# Patient Record
Sex: Male | Born: 1990 | Race: White | Hispanic: No | Marital: Married | Smoking: Former smoker
Health system: Southern US, Community
[De-identification: ages and names within clinical notes are randomized; demographics above are authoritative.]

## PROBLEM LIST (undated history)

## (undated) DIAGNOSIS — E781 Pure hyperglyceridemia: Secondary | ICD-10-CM

## (undated) DIAGNOSIS — K219 Gastro-esophageal reflux disease without esophagitis: Secondary | ICD-10-CM

## (undated) DIAGNOSIS — E039 Hypothyroidism, unspecified: Secondary | ICD-10-CM

## (undated) DIAGNOSIS — D47Z2 Castleman disease: Secondary | ICD-10-CM

## (undated) HISTORY — DX: Castleman disease: D47.Z2

## (undated) HISTORY — PX: APPENDECTOMY: SHX54

## (undated) HISTORY — PX: ANKLE SURGERY: SHX546

## (undated) HISTORY — PX: WISDOM TOOTH EXTRACTION: SHX21

---
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
## 2011-01-20 ENCOUNTER — Other Ambulatory Visit: Payer: Self-pay | Admitting: *Deleted

## 2011-01-20 DIAGNOSIS — R222 Localized swelling, mass and lump, trunk: Secondary | ICD-10-CM

## 2011-01-24 ENCOUNTER — Ambulatory Visit
Admission: RE | Admit: 2011-01-24 | Discharge: 2011-01-24 | Disposition: A | Discharge status: Home / Self Care | Payer: 59 | Source: Ambulatory Visit | Attending: *Deleted | Admitting: *Deleted

## 2011-01-24 DIAGNOSIS — R222 Localized swelling, mass and lump, trunk: Secondary | ICD-10-CM

## 2011-01-24 MED ORDER — IOHEXOL 300 MG/ML  SOLN
75.0000 mL | Freq: Once | INTRAMUSCULAR | Status: AC | PRN
  Administered 2011-01-24: 75 mL via INTRAVENOUS

## 2011-02-08 ENCOUNTER — Encounter (INDEPENDENT_AMBULATORY_CARE_PROVIDER_SITE_OTHER): Payer: Self-pay | Admitting: General Surgery

## 2011-02-09 ENCOUNTER — Ambulatory Visit (INDEPENDENT_AMBULATORY_CARE_PROVIDER_SITE_OTHER): Payer: 59 | Admitting: General Surgery

## 2011-02-09 ENCOUNTER — Encounter (INDEPENDENT_AMBULATORY_CARE_PROVIDER_SITE_OTHER): Payer: Self-pay | Admitting: General Surgery

## 2011-02-09 VITALS — BP 148/88 | HR 64 | Temp 97.7°F | Resp 18 | Ht 74.5 in | Wt 304.0 lb

## 2011-02-09 DIAGNOSIS — R590 Localized enlarged lymph nodes: Secondary | ICD-10-CM

## 2011-02-09 DIAGNOSIS — R599 Enlarged lymph nodes, unspecified: Secondary | ICD-10-CM

## 2011-02-09 NOTE — Progress Notes (Signed)
Chief Complaint  Patient presents with  . Other    new pt eval of enlarged lymph nodes in left axilla seen on CT scan     HPI Jonathon Buchanan is a 20 y.o. male.   HPI 20 year old Caucasian male referred by his primary care physician's office for evaluation of an enlarged left axillary lymph node. The patient underwent a routine physical exam and screening for his job. As part of that process he underwent a chest x-ray at [facility] which demonstrated an abnormality in his lungs. This prompted a CT scan of his chest which was performed on October 2. The CT did not finding the long abnormality; however, it did find an enlarged left axillary lymph node measuring 22 x 37 x 34 mm.  The patient denies any fevers, chills, night sweats, weight loss, or injury to his left axilla. He denies any other enlarged glands or lymph nodes. He denies any nausea, vomiting diarrhea or constipation. He denies any personal history of cancer. His maternal grandmother had lung cancer. There is some family history of lupus and rheumatoid arthritis. He denies any tobacco or drug use. He denies any lumps or bumps in his pectoral area. Past Medical History  Diagnosis Date  . Thyroid disorder     hypothyrodism     Past Surgical History  Procedure Date  . Appendectomy   . Ankle surgery     bone removed from both ankles     History reviewed. No pertinent family history.  Social History History  Substance Use Topics  . Smoking status: Former Games developer  . Smokeless tobacco: Never Used  . Alcohol Use: Yes    Allergies  Allergen Reactions  . Sulfa Antibiotics Rash    Current Outpatient Prescriptions  Medication Sig Dispense Refill  . levothyroxine (SYNTHROID, LEVOTHROID) 125 MCG tablet Take 125 mcg by mouth daily.          Review of Systems Review of Systems  Constitutional: Negative for fever, chills, activity change, fatigue and unexpected weight change.  HENT: Negative for hearing  loss, congestion, sore throat, trouble swallowing, neck pain, neck stiffness and voice change.   Eyes: Negative for visual disturbance.  Respiratory: Negative for cough, chest tightness and wheezing.   Cardiovascular: Negative for chest pain, palpitations and leg swelling.  Gastrointestinal: Negative for nausea, vomiting, abdominal pain, diarrhea, constipation, blood in stool, abdominal distention, anal bleeding and rectal pain.  Genitourinary: Negative for hematuria and difficulty urinating.  Musculoskeletal: Negative for arthralgias.  Skin: Negative for rash and wound.       Some scattered ingrown hairs  Neurological: Negative for seizures, syncope, weakness and headaches.  Hematological: Negative for adenopathy. Does not bruise/bleed easily.  Psychiatric/Behavioral: Negative for confusion.    Blood pressure 148/88, pulse 64, temperature 97.7 F (36.5 C), resp. rate 18, height 6' 2.5" (1.892 m), weight 304 lb (137.893 kg).  Physical Exam Physical Exam  Vitals reviewed. Constitutional: He is oriented to person, place, and time. He appears well-developed and well-nourished.  HENT:  Head: Normocephalic.  Eyes: Conjunctivae are normal. No scleral icterus.  Neck: Normal range of motion. Neck supple. No tracheal deviation present. No thyromegaly present.  Cardiovascular: Normal rate, regular rhythm, normal heart sounds and intact distal pulses.   Pulmonary/Chest: Effort normal and breath sounds normal. No respiratory distress. He has no wheezes. Right breast exhibits no mass and no skin change. Left breast exhibits no mass and no skin change. Breasts are symmetrical.  Abdominal: Soft. Bowel sounds  are normal. He exhibits no distension. There is no tenderness.  Musculoskeletal: Normal range of motion. He exhibits no edema and no tenderness.  Lymphadenopathy:       Head (right side): No submandibular, no preauricular and no posterior auricular adenopathy present.       Head (left side): No  submandibular, no preauricular and no posterior auricular adenopathy present.    He has no cervical adenopathy.    He has no axillary adenopathy.       Right axillary: No pectoral and no lateral adenopathy present.       Left axillary: No pectoral and no lateral adenopathy present.      Right: No inguinal and no supraclavicular adenopathy present.       Left: No inguinal and no supraclavicular adenopathy present.  Neurological: He is alert and oriented to person, place, and time. He exhibits normal muscle tone.  Skin: Skin is warm and dry. No rash noted. No erythema.  Psychiatric: He has a normal mood and affect. His behavior is normal. Judgment and thought content normal.    Data Reviewed CT CHEST WITH CONTRAST  Technique: Multidetector CT imaging of the chest was performed  following the standard protocol during bolus administration of  intravenous contrast.  Contrast: 75mL OMNIPAQUE IOHEXOL 300 MG/ML IV SOLN  Comparison: Dictated report from chest x[facility] from [facility] dated 01/17/2011   Findings: On the lung window images, no lung infiltrate is seen.  No lung nodule is noted. There is no evidence of pleural effusion.  No bony abnormality is seen.   On soft tissue window images, the thyroid gland is unremarkable.  No mediastinal or hilar adenopathy is seen. The central airways  are patent. However, there is an enlarged left axillary node  present. This left axillary node measures 22 x 37 x 34 mm.  Clinical correlation is recommended and biopsy could be performed  of this abnormally enlarged node. No bony abnormality is noted.  What is seen of the upper abdomen is unremarkable although there  may be mild fatty infiltration of the liver.   IMPRESSION:  1. No mediastinal or hilar adenopathy or mass is seen. The area  questioned on chest x-ray may have represented overlapping  vasculature. Direct comparison with the prior chest x-ray would be  helpful.    2. However, there is an abnormally enlarged lymph node in the left  axilla. Consider biopsy.   Assessment    Left axillary enlarged lymph node Hypothyroidism    Plan    We discussed lymphadenopathy and lymph nodes. The patient was given educational material. My best guess is that this is a benign reactive lymph node given the lack of symptoms & no other LAD detected on physical exam . However it is somewhat large measuring around 4 x 3 cm. I believe the quickest and most direct way to evaluate this lesion would be to perform an ultrasound-guided core biopsy of the enlarged lymph node. We did discuss another management option such as observation for 8 weeks with repeat imaging. The patient has elected to proceed with an ultrasound-guided core biopsy of the enlarged lymph node. I will base the followup on the results of the biopsy.  Mary Sella. Andrey Campanile, MD, FACS       Gaynelle Adu M 02/09/2011, 10:05 AM

## 2011-02-09 NOTE — Patient Instructions (Signed)
Lymphadenopathy Lymphadenopathy means "disease of the lymph glands." But the term is usually used to describe swollen or enlarged lymph glands, also called lymph nodes. These are the bean-shaped organs found in many locations including the neck, underarm, and groin. Lymph glands are part of the immune system, which fights infections in your body. Lymphadenopathy can occur in just one area of the body, such as the neck, or it can be generalized, with lymph node enlargement in several areas. The nodes found in the neck are the most common sites of lymphadenopathy. CAUSES  When your immune system responds to germs (such as viruses or bacteria ), infection-fighting cells and fluid build up. This causes the glands to grow in size. This is usually not something to worry about. Sometimes, the glands themselves can become infected and inflamed. This is called lymphadenitis. Enlarged lymph nodes can be caused by many diseases:  Bacterial disease, such as strep throat or a skin infection.   Viral disease, such as a common cold.   Other germs, such as lyme disease, tuberculosis, or sexually transmitted diseases.   Cancers, such as lymphoma (cancer of the lymphatic system) or leukemia (cancer of the white blood cells).   Inflammatory diseases such as lupus or rheumatoid arthritis.   Reactions to medications.  Many of the diseases above are rare, but important. This is why you should see your caregiver if you have lymphadenopathy. SYMPTOMS   Swollen, enlarged lumps in the neck, back of the head or other locations.   Tenderness.   Warmth or redness of the skin over the lymph nodes.   Fever.  DIAGNOSIS  Enlarged lymph nodes are often near the source of infection. They can help healthcare providers diagnose your illness. For instance:   Swollen lymph nodes around the jaw might be caused by an infection in the mouth.   Enlarged glands in the neck often signal a throat infection.   Lymph nodes that  are swollen in more than one area often indicate an illness caused by a virus.  Your caregiver most likely will know what is causing your lymphadenopathy after listening to your history and examining you. Blood tests, x-rays or other tests may be needed. If the cause of the enlarged lymph node cannot be found, and it does not go away by itself, then a biopsy may be needed. Your caregiver will discuss this with you. TREATMENT  Treatment for your enlarged lymph nodes will depend on the cause. Many times the nodes will shrink to normal size by themselves, with no treatment. Antibiotics or other medicines may be needed for infection. Only take over-the-counter or prescription medicines for pain, discomfort or fever as directed by your caregiver. HOME CARE INSTRUCTIONS  Swollen lymph glands usually return to normal when the underlying medical condition goes away. If they persist, contact your health-care provider. He/she might prescribe antibiotics or other treatments, depending on the diagnosis. Take any medications exactly as prescribed. Keep any follow-up appointments made to check on the condition of your enlarged nodes.  SEEK MEDICAL CARE IF:   Swelling lasts for more than two weeks.   You have symptoms such as weight loss, night sweats, fatigue or fever that does not go away.   The lymph nodes are hard, seem fixed to the skin or are growing rapidly.   Skin over the lymph nodes is red and inflamed. This could mean there is an infection.  SEEK IMMEDIATE MEDICAL CARE IF:   Fluid starts leaking from the area of the   enlarged lymph node.   You develop a fever of 102 F (38.9 C) or greater.   Severe pain develops (not necessarily at the site of a large lymph node).   You develop chest pain or shortness of breath.   You develop worsening abdominal pain.  MAKE SURE YOU:   Understand these instructions.   Will watch your condition.   Will get help right away if you are not doing well or get  worse.  Document Released: 01/18/2008 Document Revised: 12/21/2010 Document Reviewed: 01/18/2008 ExitCare Patient Information 2012 [employer]. 

## 2011-02-10 NOTE — Progress Notes (Signed)
Addended byLiliana Cline on: 02/10/2011 09:52 AM   Modules accepted: Orders

## 2011-02-16 ENCOUNTER — Other Ambulatory Visit: Payer: Self-pay | Admitting: Diagnostic Radiology

## 2011-02-16 ENCOUNTER — Ambulatory Visit
Admission: RE | Admit: 2011-02-16 | Discharge: 2011-02-16 | Disposition: A | Discharge status: Home / Self Care | Payer: 59 | Source: Ambulatory Visit | Attending: General Surgery | Admitting: General Surgery

## 2011-02-16 DIAGNOSIS — R590 Localized enlarged lymph nodes: Secondary | ICD-10-CM

## 2011-02-21 ENCOUNTER — Telehealth (INDEPENDENT_AMBULATORY_CARE_PROVIDER_SITE_OTHER): Payer: Self-pay | Admitting: General Surgery

## 2011-02-21 NOTE — Telephone Encounter (Signed)
Patient has appt scheduled 03/10/11.

## 2011-02-21 NOTE — Telephone Encounter (Signed)
Message copied by Liliana Cline on Tue Feb 21, 2011  2:45 PM ------      Message from: Andrey Campanile, ERIC M      Created: Tue Feb 21, 2011  2:15 PM       Need to see pt in f/u appt to discuss path & recommended plan

## 2011-02-21 NOTE — Telephone Encounter (Signed)
Path results sent to patient's primary MD per Dr Andrey Campanile.

## 2011-03-10 ENCOUNTER — Encounter (INDEPENDENT_AMBULATORY_CARE_PROVIDER_SITE_OTHER): Payer: 59 | Admitting: General Surgery

## 2011-03-10 ENCOUNTER — Telehealth (INDEPENDENT_AMBULATORY_CARE_PROVIDER_SITE_OTHER): Payer: Self-pay | Admitting: General Surgery

## 2011-03-14 ENCOUNTER — Telehealth (INDEPENDENT_AMBULATORY_CARE_PROVIDER_SITE_OTHER): Payer: Self-pay | Admitting: General Surgery

## 2011-03-14 NOTE — Telephone Encounter (Signed)
Made patient appt for 03/23/11 at 3:15pm. Left message for patient to call back if this is not a good date/time and to ask directly for me.

## 2011-03-14 NOTE — Telephone Encounter (Signed)
Message copied by Liliana Cline on Tue Mar 14, 2011  8:54 AM ------      Message from: Jacqulyn Cane      Created: Fri Mar 10, 2011  3:38 PM      Regarding: appt      Contact: [phone]       Patient was in lobby on 03/10/11 and Dr. Andrey Campanile was behind one hour and he couldn't wait.  Please call him at [phone].  The soonest I could find was in Jan 2013. Thanks  [employer]

## 2011-03-23 ENCOUNTER — Ambulatory Visit (INDEPENDENT_AMBULATORY_CARE_PROVIDER_SITE_OTHER): Payer: 59 | Admitting: General Surgery

## 2011-03-23 ENCOUNTER — Encounter (INDEPENDENT_AMBULATORY_CARE_PROVIDER_SITE_OTHER): Payer: Self-pay | Admitting: General Surgery

## 2011-03-23 VITALS — BP 160/102 | HR 80 | Temp 97.8°F | Resp 18 | Ht 75.0 in | Wt 306.4 lb

## 2011-03-23 DIAGNOSIS — R599 Enlarged lymph nodes, unspecified: Secondary | ICD-10-CM

## 2011-03-23 DIAGNOSIS — R590 Localized enlarged lymph nodes: Secondary | ICD-10-CM | POA: Insufficient documentation

## 2011-03-23 NOTE — Progress Notes (Signed)
Chief Complaint  Patient presents with  . Follow-up    reck lymph nodes LOV-02/09/11    HPI Jonathon Buchanan is a 20 y.o. male.   HPI 20-year-old Caucasian male referred by his primary care physician's office for evaluation of an enlarged left axillary lymph node. The patient underwent a routine physical exam and screening for his job. As part of that process he underwent a chest x-ray at [facility] which demonstrated an abnormality in his lungs. This prompted a CT scan of his chest which was performed on October 2. The CT did not finding the long abnormality; however, it did find an enlarged left axillary lymph node measuring 22 x 37 x 34 mm.  The patient denies any fevers, chills, night sweats, weight loss, or injury to his left axilla. He denies any other enlarged glands or lymph nodes. He denies any nausea, vomiting diarrhea or constipation. He denies any personal history of cancer. His maternal grandmother had lung cancer. There is some family history of lupus and rheumatoid arthritis. However, there is a family history of Castleman's disease in several relatives. He is unsure as to the type. He denies any tobacco or drug use. He denies any lumps or bumps in his pectoral area.  He underwent u/s guided biopsy of the Left axillary lymph node on 10/25.  He is here to discuss the results.    No changes to PMH, PSH, ALL, MEDs, FAMH, SOCH since last visit. Past Medical History  Diagnosis Date  . Thyroid disorder     hypothyrodism     Past Surgical History  Procedure Date  . Appendectomy   . Ankle surgery     bone removed from both ankles     Family History  Problem Relation Age of Onset  . Cancer Maternal Grandmother     pt unaware of what kind  . Lupus Maternal Aunt   . Cancer Maternal Grandfather     lung  . Hyperlipidemia Paternal Grandmother   . Hyperlipidemia Paternal Grandfather     Social History History  Substance Use Topics  . Smoking status:  Former Smoker  . Smokeless tobacco: Current User    Types: Chew  . Alcohol Use: Yes    Allergies  Allergen Reactions  . Sulfa Antibiotics Rash    Current Outpatient Prescriptions  Medication Sig Dispense Refill  . levothyroxine (SYNTHROID, LEVOTHROID) 125 MCG tablet Take 125 mcg by mouth daily.          Review of Systems Review of Systems  Constitutional: Negative for fever, chills, activity change, fatigue and unexpected weight change.  HENT: Negative for hearing loss, congestion, sore throat, trouble swallowing, neck pain, neck stiffness and voice change.   Eyes: Negative for visual disturbance.  Respiratory: Negative for cough, chest tightness and wheezing.   Cardiovascular: Negative for chest pain, palpitations and leg swelling.  Gastrointestinal: Negative for nausea, vomiting, abdominal pain, diarrhea, constipation, blood in stool, abdominal distention, anal bleeding and rectal pain.  Genitourinary: Negative for hematuria and difficulty urinating.  Musculoskeletal: Negative for arthralgias.  Skin: Negative for rash and wound.       Some scattered ingrown hairs  Neurological: Negative for seizures, syncope, weakness and headaches.  Hematological: Negative for adenopathy. Does not bruise/bleed easily.  Psychiatric/Behavioral: Negative for confusion.    Blood pressure 160/102, pulse 80, temperature 97.8 F (36.6 C), temperature source Temporal, resp. rate 18, height 6' 3" (1.905 m), weight 306 lb 6.4 oz (138.982 kg).  Physical Exam Physical   Exam  Vitals reviewed. Constitutional: He is oriented to person, place, and time. He appears well-developed and well-nourished.  HENT:  Head: Normocephalic.  Eyes: Conjunctivae are normal. No scleral icterus.  Neck: Normal range of motion. Neck supple. No tracheal deviation present. No thyromegaly present.  Cardiovascular: Normal rate, regular rhythm, normal heart sounds and intact distal pulses.   Pulmonary/Chest: Effort normal and  breath sounds normal. No respiratory distress. He has no wheezes. Right breast exhibits no mass and no skin change. Left breast exhibits no mass and no skin change. Breasts are symmetrical.  Abdominal: Soft. Bowel sounds are normal. He exhibits no distension. There is no tenderness.  Musculoskeletal: Normal range of motion. He exhibits no edema and no tenderness.  Lymphadenopathy:       Head (right side): No submandibular, no preauricular and no posterior auricular adenopathy present.       Head (left side): No submandibular, no preauricular and no posterior auricular adenopathy present.    He has no cervical adenopathy.    He has no axillary adenopathy.       Right axillary: No pectoral and no lateral adenopathy present.       Left axillary: No pectoral and no lateral adenopathy present.      Right: No inguinal and no supraclavicular adenopathy present.       Left: No inguinal and no supraclavicular adenopathy present.  Neurological: He is alert and oriented to person, place, and time. He exhibits normal muscle tone.  Skin: Skin is warm and dry. No rash noted. No erythema.  Psychiatric: He has a normal mood and affect. His behavior is normal. Judgment and thought content normal.    Data Reviewed CT CHEST WITH CONTRAST  Technique: Multidetector CT imaging of the chest was performed  following the standard protocol during bolus administration of  intravenous contrast.  Contrast: 75mL OMNIPAQUE IOHEXOL 300 MG/ML IV SOLN  Comparison: Dictated report from chest x-ray from [facility] dated 01/17/2011   Findings: On the lung window images, no lung infiltrate is seen.  No lung nodule is noted. There is no evidence of pleural effusion.  No bony abnormality is seen.   On soft tissue window images, the thyroid gland is unremarkable.  No mediastinal or hilar adenopathy is seen. The central airways  are patent. However, there is an enlarged left axillary node  present.  This left axillary node measures 22 x 37 x 34 mm.  Clinical correlation is recommended and biopsy could be performed  of this abnormally enlarged node. No bony abnormality is noted.  What is seen of the upper abdomen is unremarkable although there  may be mild fatty infiltration of the liver.   IMPRESSION:  1. No mediastinal or hilar adenopathy or mass is seen. The area  questioned on chest x-ray may have represented overlapping  vasculature. Direct comparison with the prior chest x-ray would be  helpful.  2. However, there is an abnormally enlarged lymph node in the left  axilla. Consider biopsy.  LEFT BREAST ULTRASOUND 02/16/11 Comparison: Chest CT obtained at [facility] at [address] on 01/24/2011.  On physical exam, no mass is palpable in the left axilla.  Findings: Ultrasound is performed, showing a 4.5 x 3.0 x 2.2 cm  oval, obliquely oriented, hypoechoic mass deep in the left axilla.  IMPRESSION:  4.5 cm probable abnormal left axillary lymph node. Ultrasound-  guided core needle biopsy is recommended and scheduled to follow.  BI-RADS CATEGORY 4:   Suspicious abnormality - biopsy should be  considered.  Recommendation: Ultrasound guided left axillary lymph node core  needle biopsy.  The final pathological diagnosis is benign lymphoid tissue with  hyperplastic changes and Castleman-like features. Surgical  excision is recommended.  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  Hypothyroidism    Plan    We discussed the biopsy results. We discussed Castleman disease. We discussed that there are 2 types of Castleman disease.  I explained that there is a unicentric as well as a multicentric form of the disease. I explained that the unicentric form is generally found incidentally. It is often not associated with peripheral lymphadenopathy. I explained that the gold standard of care is for  surgical resection of the involved lymph node. I explained that sometimes Castleman's is associated with malignancy such as Hodgkin's lymphoma and non-Hodgkin's lymphoma. Based on the available clinical information and lack of additional adenopathy I doubt the patient has multicentric Castleman's disease.     I recommended an excisional biopsy. We discussed the risks and benefits of surgery including but not limited to bleeding, infection, injury to surrounding structures, numbness to the medial aspect of his left arm and chest wall, hematoma formation, seroma formation, general anesthesia risk, blood clot formation, as well as the typical postoperative recovery course. I also discussed the possibility of needing to leave a surgical drain at the time of surgery. I informed him that he would not be allowed to do any heavy lifting, pushing, or pulling with his left arm for at least 2 weeks.   Chasta Deshpande M. Hamlet Lasecki, MD, FACS       Shawnta Zimbelman M 03/23/2011, 4:19 PM    

## 2011-03-30 ENCOUNTER — Encounter (HOSPITAL_COMMUNITY)
Admission: RE | Admit: 2011-03-30 | Discharge: 2011-03-30 | Disposition: A | Discharge status: Home / Self Care | Payer: 59 | Source: Ambulatory Visit | Attending: General Surgery | Admitting: General Surgery

## 2011-03-30 ENCOUNTER — Encounter (HOSPITAL_COMMUNITY): Payer: Self-pay

## 2011-03-30 NOTE — Progress Notes (Signed)
Call to [facility], left message for Norval Gable, regarding not finding orders in EPIC chart. Spoke with Lesly Rubenstein first & she referred me to Advice worker.

## 2011-03-30 NOTE — Progress Notes (Signed)
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

## 2011-04-04 ENCOUNTER — Encounter (HOSPITAL_BASED_OUTPATIENT_CLINIC_OR_DEPARTMENT_OTHER): Payer: Self-pay | Admitting: *Deleted

## 2011-04-06 ENCOUNTER — Ambulatory Visit (HOSPITAL_BASED_OUTPATIENT_CLINIC_OR_DEPARTMENT_OTHER): Payer: 59 | Admitting: Anesthesiology

## 2011-04-06 ENCOUNTER — Ambulatory Visit (HOSPITAL_COMMUNITY): Admission: RE | Admit: 2011-04-06 | Payer: 59 | Source: Ambulatory Visit | Admitting: General Surgery

## 2011-04-06 ENCOUNTER — Encounter (HOSPITAL_BASED_OUTPATIENT_CLINIC_OR_DEPARTMENT_OTHER): Payer: Self-pay | Admitting: Anesthesiology

## 2011-04-06 ENCOUNTER — Ambulatory Visit (HOSPITAL_BASED_OUTPATIENT_CLINIC_OR_DEPARTMENT_OTHER)
Admission: RE | Admit: 2011-04-06 | Discharge: 2011-04-06 | Disposition: A | Discharge status: Home / Self Care | Payer: 59 | Source: Ambulatory Visit | Attending: General Surgery | Admitting: General Surgery

## 2011-04-06 ENCOUNTER — Encounter (HOSPITAL_BASED_OUTPATIENT_CLINIC_OR_DEPARTMENT_OTHER): Payer: Self-pay | Admitting: *Deleted

## 2011-04-06 ENCOUNTER — Other Ambulatory Visit (INDEPENDENT_AMBULATORY_CARE_PROVIDER_SITE_OTHER): Payer: Self-pay | Admitting: General Surgery

## 2011-04-06 ENCOUNTER — Encounter (HOSPITAL_BASED_OUTPATIENT_CLINIC_OR_DEPARTMENT_OTHER)
Admission: RE | Disposition: A | Discharge status: Home / Self Care | Payer: Self-pay | Source: Ambulatory Visit | Attending: General Surgery

## 2011-04-06 ENCOUNTER — Encounter (HOSPITAL_COMMUNITY): Admission: RE | Payer: Self-pay | Source: Ambulatory Visit

## 2011-04-06 DIAGNOSIS — K219 Gastro-esophageal reflux disease without esophagitis: Secondary | ICD-10-CM | POA: Insufficient documentation

## 2011-04-06 DIAGNOSIS — R599 Enlarged lymph nodes, unspecified: Secondary | ICD-10-CM | POA: Insufficient documentation

## 2011-04-06 DIAGNOSIS — Z01812 Encounter for preprocedural laboratory examination: Secondary | ICD-10-CM | POA: Insufficient documentation

## 2011-04-06 DIAGNOSIS — R590 Localized enlarged lymph nodes: Secondary | ICD-10-CM

## 2011-04-06 DIAGNOSIS — E039 Hypothyroidism, unspecified: Secondary | ICD-10-CM | POA: Insufficient documentation

## 2011-04-06 HISTORY — DX: Pure hyperglyceridemia: E78.1

## 2011-04-06 HISTORY — PX: LYMPH NODE BIOPSY: SHX201

## 2011-04-06 HISTORY — DX: Gastro-esophageal reflux disease without esophagitis: K21.9

## 2011-04-06 HISTORY — DX: Hypothyroidism, unspecified: E03.9

## 2011-04-06 LAB — POCT HEMOGLOBIN-HEMACUE: Hemoglobin: 14 g/dL (ref 13.0–17.0)

## 2011-04-06 SURGERY — LYMPH NODE BIOPSY
Anesthesia: General | Site: Axilla | Laterality: Left | Wound class: Clean

## 2011-04-06 SURGERY — LYMPHADENECTOMY, AXILLARY
Anesthesia: General | Laterality: Left

## 2011-04-06 MED ORDER — SODIUM CHLORIDE 0.9 % IV SOLN: 250.0000 mL | INTRAVENOUS | Status: DC | PRN

## 2011-04-06 MED ORDER — PROPOFOL 10 MG/ML IV EMUL
INTRAVENOUS | Status: DC | PRN
  Administered 2011-04-06: 400 mg via INTRAVENOUS

## 2011-04-06 MED ORDER — BACITRACIN 50000 UNITS IM SOLR
INTRAMUSCULAR | Status: AC
  Filled 2011-04-06: qty 50000

## 2011-04-06 MED ORDER — OXYCODONE-ACETAMINOPHEN 5-325 MG PO TABS: 1.0000 | ORAL_TABLET | ORAL | Status: AC | PRN

## 2011-04-06 MED ORDER — CEFAZOLIN SODIUM-DEXTROSE 2-3 GM-% IV SOLR
2.0000 g | INTRAVENOUS | Status: AC
  Administered 2011-04-06: 2 g via INTRAVENOUS

## 2011-04-06 MED ORDER — PROMETHAZINE HCL 25 MG/ML IJ SOLN: 12.5000 mg | Freq: Four times a day (QID) | INTRAMUSCULAR | Status: DC | PRN

## 2011-04-06 MED ORDER — SODIUM CHLORIDE 0.9 % IJ SOLN: 3.0000 mL | Freq: Two times a day (BID) | INTRAMUSCULAR | Status: DC

## 2011-04-06 MED ORDER — FENTANYL CITRATE 0.05 MG/ML IJ SOLN
INTRAMUSCULAR | Status: DC | PRN
  Administered 2011-04-06: 25 ug via INTRAVENOUS
  Administered 2011-04-06: 100 ug via INTRAVENOUS
  Administered 2011-04-06: 25 ug via INTRAVENOUS
  Administered 2011-04-06 (×3): 50 ug via INTRAVENOUS

## 2011-04-06 MED ORDER — SODIUM CHLORIDE 0.9 % IV SOLN
INTRAVENOUS | Status: AC
  Filled 2011-04-06: qty 500

## 2011-04-06 MED ORDER — MORPHINE SULFATE 2 MG/ML IJ SOLN: 1.0000 mg | INTRAMUSCULAR | Status: DC | PRN

## 2011-04-06 MED ORDER — MIDAZOLAM HCL 5 MG/5ML IJ SOLN
INTRAMUSCULAR | Status: DC | PRN
  Administered 2011-04-06: 2 mg via INTRAVENOUS

## 2011-04-06 MED ORDER — PROMETHAZINE HCL 25 MG/ML IJ SOLN: 6.2500 mg | INTRAMUSCULAR | Status: DC | PRN

## 2011-04-06 MED ORDER — ACETAMINOPHEN 325 MG PO TABS: 650.0000 mg | ORAL_TABLET | ORAL | Status: DC | PRN

## 2011-04-06 MED ORDER — ACETAMINOPHEN 650 MG RE SUPP: 650.0000 mg | RECTAL | Status: DC | PRN

## 2011-04-06 MED ORDER — LACTATED RINGERS IV SOLN
INTRAVENOUS | Status: DC
  Administered 2011-04-06 (×4): via INTRAVENOUS

## 2011-04-06 MED ORDER — DEXAMETHASONE SODIUM PHOSPHATE 4 MG/ML IJ SOLN
INTRAMUSCULAR | Status: DC | PRN
  Administered 2011-04-06: 10 mg via INTRAVENOUS

## 2011-04-06 MED ORDER — BUPIVACAINE-EPINEPHRINE 0.25% -1:200000 IJ SOLN
INTRAMUSCULAR | Status: DC | PRN
  Administered 2011-04-06: 30 mL

## 2011-04-06 MED ORDER — HYDROMORPHONE HCL PF 1 MG/ML IJ SOLN
0.2500 mg | INTRAMUSCULAR | Status: DC | PRN
  Administered 2011-04-06 (×2): 0.5 mg via INTRAVENOUS

## 2011-04-06 MED ORDER — SODIUM CHLORIDE 0.9 % IJ SOLN: 3.0000 mL | INTRAMUSCULAR | Status: DC | PRN

## 2011-04-06 MED ORDER — OXYCODONE HCL 5 MG PO TABS: 5.0000 mg | ORAL_TABLET | ORAL | Status: DC | PRN

## 2011-04-06 MED ORDER — ONDANSETRON HCL 4 MG/2ML IJ SOLN: 4.0000 mg | Freq: Four times a day (QID) | INTRAMUSCULAR | Status: DC | PRN

## 2011-04-06 SURGICAL SUPPLY — 54 items
APL SKNCLS STERI-STRIP NONHPOA (GAUZE/BANDAGES/DRESSINGS) ×2
APPLIER CLIP 9.375 MED OPEN (MISCELLANEOUS) ×9
APR CLP MED 9.3 20 MLT OPN (MISCELLANEOUS) ×6
BANDAGE ELASTIC 6 VELCRO ST LF (GAUZE/BANDAGES/DRESSINGS) ×3 IMPLANT
BENZOIN TINCTURE PRP APPL 2/3 (GAUZE/BANDAGES/DRESSINGS) ×3 IMPLANT
BLADE HEX COATED 2.75 (ELECTRODE) ×3 IMPLANT
BLADE SURG 15 STRL LF DISP TIS (BLADE) ×2 IMPLANT
BLADE SURG 15 STRL SS (BLADE) ×3
CANISTER SUCTION 1200CC (MISCELLANEOUS) ×3 IMPLANT
CHLORAPREP W/TINT 26ML (MISCELLANEOUS) ×3 IMPLANT
CLIP APPLIE 9.375 MED OPEN (MISCELLANEOUS) ×4 IMPLANT
COVER MAYO STAND STRL (DRAPES) ×6 IMPLANT
COVER TABLE BACK 60X90 (DRAPES) ×3 IMPLANT
DECANTER SPIKE VIAL GLASS SM (MISCELLANEOUS) IMPLANT
DRAIN CHANNEL 19F RND (DRAIN) ×3 IMPLANT
DRAPE U-SHAPE 76X120 STRL (DRAPES) ×1 IMPLANT
DRAPE UTILITY XL STRL (DRAPES) ×1 IMPLANT
DRSG TEGADERM 4X4.75 (GAUZE/BANDAGES/DRESSINGS) ×2 IMPLANT
ELECT REM PT RETURN 9FT ADLT (ELECTROSURGICAL) ×3
ELECTRODE REM PT RTRN 9FT ADLT (ELECTROSURGICAL) ×2 IMPLANT
EVACUATOR SILICONE 100CC (DRAIN) ×3 IMPLANT
GAUZE SPONGE 4X4 12PLY STRL LF (GAUZE/BANDAGES/DRESSINGS) ×2 IMPLANT
GLOVE BIO SURGEON STRL SZ7.5 (GLOVE) ×3 IMPLANT
GLOVE BIOGEL PI IND STRL 6.5 (GLOVE) ×1 IMPLANT
GLOVE BIOGEL PI IND STRL 8 (GLOVE) ×2 IMPLANT
GLOVE BIOGEL PI INDICATOR 6.5 (GLOVE) ×1
GLOVE BIOGEL PI INDICATOR 8 (GLOVE) ×1
GLOVE ECLIPSE 6.5 STRL STRAW (GLOVE) ×4 IMPLANT
GOWN PREVENTION PLUS XLARGE (GOWN DISPOSABLE) ×5 IMPLANT
GOWN STRL REIN 2XL LVL4 (GOWN DISPOSABLE) ×2 IMPLANT
NDL HYPO 25X1 1.5 SAFETY (NEEDLE) ×1 IMPLANT
NEEDLE HYPO 25X1 1.5 SAFETY (NEEDLE) ×3 IMPLANT
NS IRRIG 1000ML POUR BTL (IV SOLUTION) ×3 IMPLANT
PACK BASIN DAY SURGERY FS (CUSTOM PROCEDURE TRAY) ×3 IMPLANT
PENCIL BUTTON HOLSTER BLD 10FT (ELECTRODE) ×3 IMPLANT
PIN SAFETY STERILE (MISCELLANEOUS) ×1 IMPLANT
SHEET MEDIUM DRAPE 40X70 STRL (DRAPES) ×3 IMPLANT
SLEEVE SCD COMPRESS KNEE MED (MISCELLANEOUS) ×3 IMPLANT
SPONGE GAUZE 4X4 12PLY (GAUZE/BANDAGES/DRESSINGS) ×2 IMPLANT
SPONGE INTESTINAL PEANUT (DISPOSABLE) ×3 IMPLANT
SPONGE LAP 18X18 X RAY DECT (DISPOSABLE) ×2 IMPLANT
SPONGE LAP 4X18 X RAY DECT (DISPOSABLE) ×3 IMPLANT
STRIP CLOSURE SKIN 1/2X4 (GAUZE/BANDAGES/DRESSINGS) ×3 IMPLANT
SUT ETHILON 3 0 FSL (SUTURE) ×3 IMPLANT
SUT MNCRL AB 4-0 PS2 18 (SUTURE) IMPLANT
SUT SILK 2 0 SH (SUTURE) IMPLANT
SUT VICRYL 3-0 CR8 SH (SUTURE) ×3 IMPLANT
SYR CONTROL 10ML LL (SYRINGE) ×3 IMPLANT
TAPE CLOTH SURG 4X10 WHT LF (GAUZE/BANDAGES/DRESSINGS) ×2 IMPLANT
TOWEL OR 17X24 6PK STRL BLUE (TOWEL DISPOSABLE) ×6 IMPLANT
TOWEL OR NON WOVEN STRL DISP B (DISPOSABLE) ×3 IMPLANT
TUBE CONNECTING 20X1/4 (TUBING) ×3 IMPLANT
WATER STERILE IRR 1000ML POUR (IV SOLUTION) ×1 IMPLANT
YANKAUER SUCT BULB TIP NO VENT (SUCTIONS) ×3 IMPLANT

## 2011-04-06 NOTE — Anesthesia Procedure Notes (Addendum)
Procedure Name: LMA Insertion Date/Time: 04/06/2011 11:22 AM Performed by: Gladys Damme Pre-anesthesia Checklist: Patient identified, Timeout performed, Emergency Drugs available, Suction available and Patient being monitored Patient Re-evaluated:Patient Re-evaluated prior to inductionOxygen Delivery Method: Circle System Utilized Preoxygenation: Pre-oxygenation with 100% oxygen Intubation Type: IV induction Ventilation: Mask ventilation without difficulty LMA: LMA with gastric port inserted LMA Size: 5.0 Number of attempts: 1 Placement Confirmation: breath sounds checked- equal and bilateral and positive ETCO2 Tube secured with: Tape Dental Injury: Teeth and Oropharynx as per pre-operative assessment

## 2011-04-06 NOTE — H&P (View-Only) (Signed)
Chief Complaint  Patient presents with  . Follow-up    reck lymph nodes LOV-02/09/11    HPI Jonathon Buchanan is a 20 y.o. male.   HPI 20 year old Caucasian male referred by his primary care physician's office for evaluation of an enlarged left axillary lymph node. The patient underwent a routine physical exam and screening for his job. As part of that process he underwent a chest x-ray at [facility] family practice which demonstrated an abnormality in his lungs. This prompted a CT scan of his chest which was performed on October 2. The CT did not finding the long abnormality; however, it did find an enlarged left axillary lymph node measuring 22 x 37 x 34 mm.  The patient denies any fevers, chills, night sweats, weight loss, or injury to his left axilla. He denies any other enlarged glands or lymph nodes. He denies any nausea, vomiting diarrhea or constipation. He denies any personal history of cancer. His maternal grandmother had lung cancer. There is some family history of lupus and rheumatoid arthritis. However, there is a family history of Castleman's disease in several relatives. He is unsure as to the type. He denies any tobacco or drug use. He denies any lumps or bumps in his pectoral area.  He underwent u/s guided biopsy of the Left axillary lymph node on 10/25.  He is here to discuss the results.    No changes to PMH, PSH, ALL, MEDs, FAMH, SOCH since last visit. Past Medical History  Diagnosis Date  . Thyroid disorder     hypothyrodism     Past Surgical History  Procedure Date  . Appendectomy   . Ankle surgery     bone removed from both ankles     Family History  Problem Relation Age of Onset  . Cancer Maternal Grandmother     pt unaware of what kind  . Lupus Maternal Aunt   . Cancer Maternal Grandfather     lung  . Hyperlipidemia Paternal Grandmother   . Hyperlipidemia Paternal Grandfather     Social History History  Substance Use Topics  . Smoking status:  Former Games developer  . Smokeless tobacco: Current User    Types: Chew  . Alcohol Use: Yes    Allergies  Allergen Reactions  . Sulfa Antibiotics Rash    Current Outpatient Prescriptions  Medication Sig Dispense Refill  . levothyroxine (SYNTHROID, LEVOTHROID) 125 MCG tablet Take 125 mcg by mouth daily.          Review of Systems Review of Systems  Constitutional: Negative for fever, chills, activity change, fatigue and unexpected weight change.  HENT: Negative for hearing loss, congestion, sore throat, trouble swallowing, neck pain, neck stiffness and voice change.   Eyes: Negative for visual disturbance.  Respiratory: Negative for cough, chest tightness and wheezing.   Cardiovascular: Negative for chest pain, palpitations and leg swelling.  Gastrointestinal: Negative for nausea, vomiting, abdominal pain, diarrhea, constipation, blood in stool, abdominal distention, anal bleeding and rectal pain.  Genitourinary: Negative for hematuria and difficulty urinating.  Musculoskeletal: Negative for arthralgias.  Skin: Negative for rash and wound.       Some scattered ingrown hairs  Neurological: Negative for seizures, syncope, weakness and headaches.  Hematological: Negative for adenopathy. Does not bruise/bleed easily.  Psychiatric/Behavioral: Negative for confusion.    Blood pressure 160/102, pulse 80, temperature 97.8 F (36.6 C), temperature source Temporal, resp. rate 18, height 6\' 3"  (1.905 m), weight 306 lb 6.4 oz (138.982 kg).  Physical Exam Physical  Exam  Vitals reviewed. Constitutional: He is oriented to person, place, and time. He appears well-developed and well-nourished.  HENT:  Head: Normocephalic.  Eyes: Conjunctivae are normal. No scleral icterus.  Neck: Normal range of motion. Neck supple. No tracheal deviation present. No thyromegaly present.  Cardiovascular: Normal rate, regular rhythm, normal heart sounds and intact distal pulses.   Pulmonary/Chest: Effort normal and  breath sounds normal. No respiratory distress. He has no wheezes. Right breast exhibits no mass and no skin change. Left breast exhibits no mass and no skin change. Breasts are symmetrical.  Abdominal: Soft. Bowel sounds are normal. He exhibits no distension. There is no tenderness.  Musculoskeletal: Normal range of motion. He exhibits no edema and no tenderness.  Lymphadenopathy:       Head (right side): No submandibular, no preauricular and no posterior auricular adenopathy present.       Head (left side): No submandibular, no preauricular and no posterior auricular adenopathy present.    He has no cervical adenopathy.    He has no axillary adenopathy.       Right axillary: No pectoral and no lateral adenopathy present.       Left axillary: No pectoral and no lateral adenopathy present.      Right: No inguinal and no supraclavicular adenopathy present.       Left: No inguinal and no supraclavicular adenopathy present.  Neurological: He is alert and oriented to person, place, and time. He exhibits normal muscle tone.  Skin: Skin is warm and dry. No rash noted. No erythema.  Psychiatric: He has a normal mood and affect. His behavior is normal. Judgment and thought content normal.    Data Reviewed CT CHEST WITH CONTRAST  Technique: Multidetector CT imaging of the chest was performed  following the standard protocol during bolus administration of  intravenous contrast.  Contrast: 75mL OMNIPAQUE IOHEXOL 300 MG/ML IV SOLN  Comparison: Dictated report from chest x-ray from [facility] dated 01/17/2011   Findings: On the lung window images, no lung infiltrate is seen.  No lung nodule is noted. There is no evidence of pleural effusion.  No bony abnormality is seen.   On soft tissue window images, the thyroid gland is unremarkable.  No mediastinal or hilar adenopathy is seen. The central airways  are patent. However, there is an enlarged left axillary node  present.  This left axillary node measures 22 x 37 x 34 mm.  Clinical correlation is recommended and biopsy could be performed  of this abnormally enlarged node. No bony abnormality is noted.  What is seen of the upper abdomen is unremarkable although there  may be mild fatty infiltration of the liver.   IMPRESSION:  1. No mediastinal or hilar adenopathy or mass is seen. The area  questioned on chest x-ray may have represented overlapping  vasculature. Direct comparison with the prior chest x-ray would be  helpful.  2. However, there is an abnormally enlarged lymph node in the left  axilla. Consider biopsy.  LEFT BREAST ULTRASOUND 02/16/11 Comparison: Chest CT obtained at [facility] at [address] on 01/24/2011.  On physical exam, no mass is palpable in the left axilla.  Findings: Ultrasound is performed, showing a 4.5 x 3.0 x 2.2 cm  oval, obliquely oriented, hypoechoic mass deep in the left axilla.  IMPRESSION:  4.5 cm probable abnormal left axillary lymph node. Ultrasound-  guided core needle biopsy is recommended and scheduled to follow.  BI-RADS CATEGORY 4:  Suspicious abnormality - biopsy should be  considered.  Recommendation: Ultrasound guided left axillary lymph node core  needle biopsy.  The final pathological diagnosis is benign lymphoid tissue with  hyperplastic changes and Castleman-like features. Surgical  excision is recommended.  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  Hypothyroidism    Plan    We discussed the biopsy results. We discussed Castleman disease. We discussed that there are 2 types of Castleman disease.  I explained that there is a unicentric as well as a multicentric form of the disease. I explained that the unicentric form is generally found incidentally. It is often not associated with peripheral lymphadenopathy. I explained that the gold standard of care is for  surgical resection of the involved lymph node. I explained that sometimes Castleman's is associated with malignancy such as Hodgkin's lymphoma and non-Hodgkin's lymphoma. Based on the available clinical information and lack of additional adenopathy I doubt the patient has multicentric Castleman's disease.     I recommended an excisional biopsy. We discussed the risks and benefits of surgery including but not limited to bleeding, infection, injury to surrounding structures, numbness to the medial aspect of his left arm and chest wall, hematoma formation, seroma formation, general anesthesia risk, blood clot formation, as well as the typical postoperative recovery course. I also discussed the possibility of needing to leave a surgical drain at the time of surgery. I informed him that he would not be allowed to do any heavy lifting, pushing, or pulling with his left arm for at least 2 weeks.   Mary Sella. Andrey Campanile, MD, FACS       Gaynelle Adu M 03/23/2011, 4:19 PM

## 2011-04-06 NOTE — Transfer of Care (Signed)
Immediate Anesthesia Transfer of Care Note  Patient: Jonathon Buchanan  Procedure(s) Performed:  LYMPH NODE BIOPSY  Patient Location: PACU  Anesthesia Type: General  Level of Consciousness: awake and alert   Airway & Oxygen Therapy: Patient Spontanous Breathing and Patient connected to face mask oxygen  Post-op Assessment: Report given to PACU RN and Post -op Vital signs reviewed and stable  Post vital signs: Reviewed and stable  Complications: No apparent anesthesia complications

## 2011-04-06 NOTE — Interval H&P Note (Signed)
History and Physical Interval Note:  04/06/2011 10:58 AM  Jonathon Buchanan  has presented today for surgery, with the diagnosis of left axillary lymph adenopathy  The various methods of treatment have been discussed with the patient and family. After consideration of risks, benefits and other options for treatment, the patient has consented to  Procedure(s): Left AXILLARY LYMPH NODE excisional biopsy as a surgical intervention .  The patients' history has been reviewed, patient examined, no change in status, stable for surgery.  I have reviewed the patients' chart and labs.  Questions were answered to the patient's satisfaction.    Mary Sella. Andrey Campanile, MD, FACS General, Bariatric, & Minimally Invasive Surgery [facility], [place]   [facility] M

## 2011-04-06 NOTE — Brief Op Note (Signed)
04/06/2011  1:13 PM  PATIENT:  Jonathon Buchanan  20 y.o. male  PRE-OPERATIVE DIAGNOSIS:  left axillary lymph adenopathy   IS:  left axillary lymph adenopathy  PROCEDURE:  Procedure(s): Left axillary LYMPH NODE BIOPSY  SURGEON:  Surgeon(s): Atilano Ina, MD  PHYSICIAN ASSISTANT:   ASSISTANTS: none   ANESTHESIA:   general  EBL:  Total I/O In: 1800 [I.V.:1800] Out: -   BLOOD ADMINISTERED:none  DRAINS: (19 Fr) Jackson-Pratt drain(s) with closed bulb suction in the left axilla   LOCAL MEDICATIONS USED:  OTHER 0.25% marcaine with epi 30cc  SPECIMEN:  Source of Specimen:  left axillary lymph node x2  (stitch marks abnormal lymph node specimen)  DISPOSITION OF SPECIMEN:  PATHOLOGY  COUNTS:  YES  TOURNIQUET:  * No tourniquets in log *  DICTATION: .Other Dictation: Dictation Number [id number]  PLAN OF CARE: Discharge to home after PACU  PATIENT DISPOSITION:  PACU - hemodynamically stable.   Delay start of Pharmacological VTE agent (>24hrs) due to surgical blood loss or risk of bleeding:  {YES/NO/NOT APPLICABLE:20182

## 2011-04-06 NOTE — Interval H&P Note (Signed)
History and Physical Interval Note:  04/06/2011 11:06 AM  Jonathon Buchanan  has presented today for surgery, with the diagnosis of left axillary lymph adenopathy  The various methods of treatment have been discussed with the patient and family. After consideration of risks, benefits and other options for treatment, the patient has consented to  Procedure(s): Left AXILLARY LYMPH NODE excisional biopsy as a surgical intervention .  The patients' history has been reviewed, patient examined, no change in status, stable for surgery.  I have reviewed the patients' chart and labs.  Questions were answered to the patient's satisfaction.     [name]. Andrey Campanile, MD, FACS General, Bariatric, & Minimally Invasive Surgery [facility], [place]

## 2011-04-06 NOTE — Anesthesia Postprocedure Evaluation (Signed)
  Anesthesia Post-op Note  Patient: Jonathon Buchanan  Procedure(s) Performed:  LYMPH NODE BIOPSY  Patient Location: PACU  Anesthesia Type: General  Level of Consciousness: awake, alert  and oriented  Airway and Oxygen Therapy: Patient Spontanous Breathing  Post-op Pain: none  Post-op Assessment: Post-op Vital signs reviewed, Patient's Cardiovascular Status Stable, Respiratory Function Stable, Patent Airway, No signs of Nausea or vomiting and Pain level controlled  Post-op Vital Signs: Reviewed and stable  Complications: No apparent anesthesia complications

## 2011-04-06 NOTE — Anesthesia Preprocedure Evaluation (Signed)
Anesthesia Evaluation  Patient identified by MRN, date of birth, ID band Patient awake    Reviewed: Allergy & Precautions, H&P , NPO status , Patient's Chart, lab work & pertinent test results  Airway Mallampati: I TM Distance: >3 FB Neck ROM: Full    Dental No notable dental hx. (+) Teeth Intact and Dental Advisory Given   Pulmonary neg pulmonary ROS,  clear to auscultation  Pulmonary exam normal       Cardiovascular neg cardio ROS Regular Normal    Neuro/Psych Negative Neurological ROS     GI/Hepatic Neg liver ROS, GERD-  Medicated and Controlled,  Endo/Other  Hypothyroidism (on Synthroid) Morbid obesity  Renal/GU negative Renal ROS     Musculoskeletal negative musculoskeletal ROS (+)   Abdominal (+) obese,   Peds  Hematology   Anesthesia Other Findings   Reproductive/Obstetrics                           Anesthesia Physical Anesthesia Plan  ASA: II  Anesthesia Plan: General   Post-op Pain Management:    Induction: Intravenous  Airway Management Planned: LMA  Additional Equipment:   Intra-op Plan:   Post-operative Plan:   Informed Consent: I have reviewed the patients History and Physical, chart, labs and discussed the procedure including the risks, benefits and alternatives for the proposed anesthesia with the patient or authorized representative who has indicated his/her understanding and acceptance.   Dental advisory given  Plan Discussed with: CRNA and Surgeon  Anesthesia Plan Comments: (Plan routine monitors, GA- LMA OK)        Anesthesia Quick Evaluation

## 2011-04-07 ENCOUNTER — Ambulatory Visit (INDEPENDENT_AMBULATORY_CARE_PROVIDER_SITE_OTHER): Payer: 59 | Admitting: General Surgery

## 2011-04-07 ENCOUNTER — Encounter (HOSPITAL_BASED_OUTPATIENT_CLINIC_OR_DEPARTMENT_OTHER): Payer: Self-pay | Admitting: General Surgery

## 2011-04-07 VITALS — BP 162/106 | HR 64 | Temp 97.4°F | Resp 18 | Ht 75.0 in | Wt 304.2 lb

## 2011-04-07 DIAGNOSIS — Z09 Encounter for follow-up examination after completed treatment for conditions other than malignant neoplasm: Secondary | ICD-10-CM

## 2011-04-07 NOTE — Progress Notes (Signed)
Chief complaint: There is a lot of drainage around the drain  Procedure: Status post left axillary lymph node excisional biopsy December 13  History of Present Ilness: 20 year old male comes in complaining of excessive drainage around his drain from surgery that he had done yesterday. He states that he is sore with certain movements.  He had about 27cc drainage yesterday and about 17cc so far today. Otherwise doing ok.  Physical Exam: BP 162/106  Pulse 64  Temp(Src) 97.4 F (36.3 C) (Temporal)  Resp 18  Ht 6\' 3"  (1.905 m)  Wt 304 lb 4 oz (138.007 kg)  BMI 38.03 kg/m2 BP 162/106  Pulse 64  Temp(Src) 97.4 F (36.3 C) (Temporal)  Resp 18  Ht 6\' 3"  (1.905 m)  Wt 304 lb 4 oz (138.007 kg)  BMI 38.03 kg/m2  Gen: alert, NAD, non-toxic appearing Pulm: Lungs clear to auscultation, symmetric chest rise CV: regular rate and rhythm Left axilla: drain intact, +serosang fluid. Minimal shadowing on gauze.    Pathology: Pending  Assessment and Plan: Status post left axillary lymph node excisional biopsy  His drain appears to be intact and working.  I told him to remove his dressing on Sunday. I told him the drain would stay in until there is less than 30 cc per day. He is going to follow up next week for followup Mary Sella. Andrey Campanile, MD, FACS General, Bariatric, & Minimally Invasive Surgery [facility], [place]

## 2011-04-07 NOTE — Patient Instructions (Signed)
Remove bandages on Sunday. Place new gauze over incision and around drain.

## 2011-04-07 NOTE — Op Note (Signed)
NAME:  PARAS, KREIDER                     ACCOUNT NO.:  MEDICAL RECORD NO.:  [id number]  LOCATION:                                 FACILITY:  PHYSICIAN:  Mary Sella. Andrey Campanile, MD     DATE OF BIRTH:  25-Jan-1991  DATE OF PROCEDURE:  04/06/2011 DATE OF DISCHARGE:                              OPERATIVE REPORT   PREOPERATIVE DIAGNOSIS:  Left axillary lymphadenopathy.  POSTOPERATIVE DIAGNOSIS:  Left axillary lymphadenopathy.  PROCEDURE:  Left axillary excisional lymph node biopsy.  SURGEON:  Mary Sella. Andrey Campanile, MD, FACS  ANESTHESIA:  General with LMA plus 30 mL of 0.25% Marcaine with epi.  SPECIMEN:  Left axillary lymph node.  Specimens x2, stitch marks abnormal lymph node.  ESTIMATED BLOOD LOSS:  Less than 50 mL.  COMPLICATIONS:  None immediately apparent.  DRAINS:  19-French Blake.  INDICATIONS FOR PROCEDURE:  The patient is a 20 year old male who underwent a screening chest x-ray for his job during a routine history and physical.  Chest x-ray demonstrated possible lymph lung abnormality. The CT of the chest was ordered which demonstrated no lung abnormality, however, he had an isolated enlarged lymph node that was about 3 x 4 cm. He was sent to me for evaluation.  Because it was somewhat enlarged lymph node, I recommended ultrasound-guided biopsy.  The biopsy came back with hyperplastic changes, consistent with Castleman disease. Therefore, I recommended surgical excision to confirm the diagnosis.  We discussed the risks and benefits of surgery including, but not limited to, bleeding, infection, injury to surrounding structures, hematoma formation, seroma formation, lymph leak, the rare possibility of lymphedema, nerve injury, paresthesias in the inner arm, the need for possible drain, possible reoperative surgery, blood clot formation, as well as typical postoperative course.  He elected to proceed with surgery.  DESCRIPTION OF PROCEDURE:  The patient's left axilla was marked in  the holding area with the patient confirming the operative site.  He was then taken back to the operating room 8 at [facility]. General LMA anesthesia was established.  Sequential compression devices were placed.  His left arm and axilla and upper chest were prepped and draped in the usual standard surgical fashion with ChloraPrep.  A surgical time-out was performed.  He received antibiotics prior to skin incision.  A 6-cm incision was made in the left axilla with a #15 blade. The subcutaneous tissue was divided with electrocautery.  The fat was divided with electrocautery.  Then, using a tonsil, I bluntly opened up the axilla.  I was able to palpate an area of abnormality that felt somewhat enlarged.  This felt like the enlarged lymph node of concern.  I started isolating it from the surrounding tissue using electrocautery as well as medium clip applier.  I was able to eventually excise this.  It felt somewhat enlarged, but it was flat and more oblong and the lymph node on CT was more oval and walnut shaped.  Upon probing the cavity, I felt an area of concern directly beneath the area that I had excised and this was seperate walnut-shaped lesion.  Taking a 3-0 Vicryl, I placed a stitch in  it to help with retraction.  I circumferentially isolated it from surrounding tissue using electrocautery as well as medium clip applier.  It had a blood vessel going into it and this was clipped with 2 clips.  I isolated and completely excised it.  This was definitely the area of abnormality, it was near the chest wall and rib cage.  It was oval and slightly larger than the size of a large walnut.  I palpated the cavity, there was no remaining palpable abnormality.  The wound was irrigated and hemostasis was achieved.  The thoracodorsal nerve was identified and preserved.  The wound was irrigated.  Because I did elect to leave a drain, a small separate stab incision was made with a  #15 blade.  The drain was placed in the cavity.  It was secured to the skin with 3-0 nylon.  The wound was then closed in 2 layers.  The deep dermis was reapproximated with inverted interrupted 3-0 Vicryl, and the skin was reapproximated with a running 4-0 Monocryl in subcuticular fashion followed by application of Dermabond, benzoin, Steri-Strips 4x4, and Tegaderm.  A drain sponge was placed.  The patient was extubated and taken to the recovery room in stable condition.  All needle, instrument, and sponge counts were correct x2.  There were no immediate complications.  The patient tolerated the procedure well.     Mary Sella. Andrey Campanile, MD     EMW/MEDQ  D:  04/06/2011  T:  04/06/2011  Job:  [id number]

## 2011-04-10 ENCOUNTER — Ambulatory Visit (INDEPENDENT_AMBULATORY_CARE_PROVIDER_SITE_OTHER): Payer: 59 | Admitting: General Surgery

## 2011-04-10 DIAGNOSIS — Z9889 Other specified postprocedural states: Secondary | ICD-10-CM

## 2011-04-10 NOTE — Patient Instructions (Signed)
Keep your appt with Dr Andrey Campanile in a couple weeks. Keep an eye on the incision and the drain site. If area looks red, feels hot or you develop fevers, please call.

## 2011-04-10 NOTE — Progress Notes (Signed)
Patient comes in status post axillary node surgical biopsy with left axillary drain. Drain has drained less than 30 cc x 3 days, being under 15 cc for 2 days. I removed drain. Area was clean, incision well healing with no redness or drainage. I covered drain site with a dry gauze and instructed patient to keep covered for 24 hours and to call with any problems. He is going to return to work on 04/21/11. He has a follow up with Dr Andrey Campanile on 04/27/10 and he is to call if he has any problems prior to this. He agrees with this plan.

## 2011-04-13 ENCOUNTER — Encounter (INDEPENDENT_AMBULATORY_CARE_PROVIDER_SITE_OTHER): Payer: 59

## 2011-04-14 ENCOUNTER — Encounter (INDEPENDENT_AMBULATORY_CARE_PROVIDER_SITE_OTHER): Payer: Self-pay | Admitting: Surgery

## 2011-04-14 ENCOUNTER — Ambulatory Visit (INDEPENDENT_AMBULATORY_CARE_PROVIDER_SITE_OTHER): Payer: 59 | Admitting: Surgery

## 2011-04-14 VITALS — BP 144/80 | HR 70 | Temp 97.3°F | Resp 16 | Ht 75.0 in | Wt 303.8 lb

## 2011-04-14 DIAGNOSIS — R590 Localized enlarged lymph nodes: Secondary | ICD-10-CM

## 2011-04-14 DIAGNOSIS — R599 Enlarged lymph nodes, unspecified: Secondary | ICD-10-CM

## 2011-04-14 NOTE — Progress Notes (Signed)
Patient seen in the urgent office because the left axillary region was more swollen but not tender or red.  Attempted aspiration revealed 3 cc of straw colored liquid.  Drain had been previously removed.  Path was benign per patient.    Impression:  Induration and probably some fluid in site of axillary biopsy  See Dr. Andrey Campanile on previously scheduled appt Apr 28, 2011

## 2011-04-14 NOTE — Patient Instructions (Signed)
Keep appt with Dr. Andrey Campanile on the 4th of Jan

## 2011-04-28 ENCOUNTER — Ambulatory Visit (INDEPENDENT_AMBULATORY_CARE_PROVIDER_SITE_OTHER): Payer: 59 | Admitting: General Surgery

## 2011-04-28 ENCOUNTER — Encounter (INDEPENDENT_AMBULATORY_CARE_PROVIDER_SITE_OTHER): Payer: Self-pay | Admitting: General Surgery

## 2011-04-28 VITALS — BP 142/94 | HR 68 | Temp 97.6°F | Resp 16 | Ht 75.0 in | Wt 317.4 lb

## 2011-04-28 DIAGNOSIS — D47Z2 Castleman disease: Secondary | ICD-10-CM

## 2011-04-28 DIAGNOSIS — Z09 Encounter for follow-up examination after completed treatment for conditions other than malignant neoplasm: Secondary | ICD-10-CM

## 2011-04-28 DIAGNOSIS — R599 Enlarged lymph nodes, unspecified: Secondary | ICD-10-CM

## 2011-04-28 NOTE — Patient Instructions (Signed)
Can resume full activity in 1 week

## 2011-04-28 NOTE — Progress Notes (Signed)
Chief complaint: Postop  Procedure: Status post left axillary lymph node excisional biopsy December 13  History of Present Ilness: 21 year old male comes in for his postoperative examination. I saw him initially several days after surgery because of drainage around his drain. He subsequently followed up with my nurse and his drain was removed. He saw Dr. Daphine Deutscher on December 23 for some swelling around his incision. Dr. Daphine Deutscher aspirated 3 cc of straw-colored fluid.  The patient states that he is doing quite well. He states that the swelling went down the day after he saw Dr. Daphine Deutscher. He denies any fevers or chills. He denies any pain in his axilla. He denies any numbness or tingling in his left upper arm. He denies any drainage from his incision  Physical Exam: Gen.-well-developed well-nourished muscular young man Pulmonary-lungs are clear Left axilla-well healed left axillary incision. No cellulitis, induration, or fluctuant. 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 There is no evidence of lymphoma  Assessment and Plan: Status post left axillary lymph node excisional biopsy  We discussed this pathology. Because this lymph node tissue could also be seen an HIV infection I recommended that the patient get an HIV test. He has very low risk factors for HIV. Moreover he has a family history of Castleman's disease so I think  this is clinically consistent with Castleman's disease.   His wound is well-healed. I will see him on a p.r.n. Basis  Mary Sella. Andrey Campanile, MD, FACS General, Bariatric, & Minimally Invasive Surgery [facility], [place]

## 2011-05-03 ENCOUNTER — Telehealth (INDEPENDENT_AMBULATORY_CARE_PROVIDER_SITE_OTHER): Payer: Self-pay | Admitting: General Surgery

## 2011-05-03 ENCOUNTER — Encounter (INDEPENDENT_AMBULATORY_CARE_PROVIDER_SITE_OTHER): Payer: Self-pay | Admitting: General Surgery

## 2011-05-03 NOTE — Telephone Encounter (Signed)
RTW note faxed to number provided. Patient made aware.

## 2011-06-07 ENCOUNTER — Ambulatory Visit (INDEPENDENT_AMBULATORY_CARE_PROVIDER_SITE_OTHER): Payer: 59 | Admitting: Family Medicine

## 2011-06-07 ENCOUNTER — Ambulatory Visit: Payer: 59

## 2011-06-07 VITALS — BP 138/85 | HR 102 | Temp 99.4°F | Resp 16 | Ht 74.0 in | Wt 304.8 lb

## 2011-06-07 DIAGNOSIS — D7289 Other specified disorders of white blood cells: Secondary | ICD-10-CM

## 2011-06-07 DIAGNOSIS — R509 Fever, unspecified: Secondary | ICD-10-CM

## 2011-06-07 DIAGNOSIS — R5381 Other malaise: Secondary | ICD-10-CM

## 2011-06-07 DIAGNOSIS — J029 Acute pharyngitis, unspecified: Secondary | ICD-10-CM

## 2011-06-07 DIAGNOSIS — R5383 Other fatigue: Secondary | ICD-10-CM

## 2011-06-07 DIAGNOSIS — B2790 Infectious mononucleosis, unspecified without complication: Secondary | ICD-10-CM

## 2011-06-07 LAB — COMPREHENSIVE METABOLIC PANEL
AST: 165 U/L — ABNORMAL HIGH (ref 0–37)
Albumin: 4.1 g/dL (ref 3.5–5.2)
Alkaline Phosphatase: 153 U/L — ABNORMAL HIGH (ref 39–117)
BUN: 8 mg/dL (ref 6–23)
Potassium: 4.2 mEq/L (ref 3.5–5.3)
Sodium: 137 mEq/L (ref 135–145)
Total Protein: 7.3 g/dL (ref 6.0–8.3)

## 2011-06-07 LAB — POCT CBC
Granulocyte percent: 13.8 %G — AB (ref 37–80)
HCT, POC: 42.1 % — AB (ref 43.5–53.7)
Hemoglobin: 13.9 g/dL — AB (ref 14.1–18.1)
Lymph, poc: 13.3 — AB (ref 0.6–3.4)
MCHC: 33 g/dL (ref 31.8–35.4)
POC Granulocyte: 2.6 (ref 2–6.9)

## 2011-06-07 LAB — POCT URINALYSIS DIPSTICK
Glucose, UA: NEGATIVE
Nitrite, UA: NEGATIVE
Urobilinogen, UA: 4

## 2011-06-07 LAB — POCT INFLUENZA A/B: Influenza A, POC: NEGATIVE

## 2011-06-07 LAB — POCT UA - MICROSCOPIC ONLY
Casts, Ur, LPF, POC: NEGATIVE
Yeast, UA: NEGATIVE

## 2011-06-07 LAB — POCT RAPID STREP A (OFFICE): Rapid Strep A Screen: NEGATIVE

## 2011-06-07 MED ORDER — MAGIC MOUTHWASH W/LIDOCAINE: 5.0000 mL | Freq: Four times a day (QID) | ORAL | Status: DC | PRN

## 2011-06-07 NOTE — Progress Notes (Signed)
Patient ID: Jonathon Buchanan MRN: [id number], DOB: Jul 01, 1990, 21 y.o. Date of Encounter: 06/07/2011, 4:11 PM  Primary Physician: [name], PA, PA  Chief Complaint:  Chief Complaint  Patient presents with  . Fever    x 6 days  . Sore Throat    x 2 days    HPI: 21 y.o. year old male presents with 6 day history of fever and myalgias, and 2 day history of sore throat. Tmax 102.6 2-3 days prior. Temp has been running around 101. On days 1-4 of this illness his only subjective complaints were the fever and myalgias, no other subjective complaints at that time. On day 3 of illness his PCP called in some Tamiflu for him, he however did not take this medication. Has been alternating Motrin with Tylenol, his fever however does return after each dose wanes. Declines cough. Mild nasal congestion, with mild sinus pressure. Bilateral ears feel full and "like they are draining." Hearing is slightly muffled. Appetite is down, he is pushing fluids however. No GI symptoms. Old smoker, currently dips. Positive sick contacts: Work Environmental manager) No Recent antibiotics  No leg trauma, sedentary periods, h/o cancer.  Past Medical History  Diagnosis Date  . GERD (gastroesophageal reflux disease)   . Hypothyroidism   . High triglycerides   . Castleman disease     Left axillary lymph node     Home Meds: Prior to Admission medications   Medication Sig Start Date End Date Taking? Authorizing Provider  levothyroxine (SYNTHROID, LEVOTHROID) 125 MCG tablet Take 125 mcg by mouth daily. AM   Yes Historical Provider, MD  Multiple Vitamins-Minerals (MULTIVITAMINS THER. W/MINERALS) TABS Take 1 tablet by mouth daily.     Yes Historical Provider, MD  omeprazole (PRILOSEC) 20 MG capsule Take 20 mg by mouth as needed.     Yes Historical Provider, MD    Allergies:  Allergies  Allergen Reactions  . Sulfa Antibiotics Rash    History   Social History  . Marital Status: Single    Spouse Name: [name]      Number of Children: [name]  . Years of Education: [name]   Occupational History  . Not on file.   Social History Main Topics  . Smoking status: Former Smoker    Quit date: 06/07/2007  . Smokeless tobacco: Current User    Types: Snuff   Comment: quit smoking 1-2 mos. ago  . Alcohol Use: Yes     occasionally  . Drug Use: No  . Sexually Active: Not on file   Other Topics Concern  . Not on file   Social History Narrative  . No narrative on file     Review of Systems: Constitutional: negative for night sweats or weight changes Cardiovascular: negative for chest pain or palpitations Respiratory: negative for hemoptysis, wheezing, or shortness of breath Abdominal: negative for abdominal pain, nausea, vomiting or diarrhea Dermatological: negative for rash Neurologic: negative for headache   Physical Exam: Blood pressure 138/85, pulse 102, temperature 99.4 F (37.4 C), temperature source Oral, resp. rate 16, height 6\' 2"  (1.88 m), weight 304 lb 12.8 oz (138.256 kg)., Body mass index is 39.13 kg/(m^2). General: Well developed, well nourished, in no acute distress. Head: Normocephalic, atraumatic, eyes without discharge, sclera non-icteric, nares are congested. Bilateral auditory canals clear, TM's are without perforation, pearly grey with reflective cone of light bilaterally. Serous OM bilaterally. Oral cavity moist, dentition normal. Posterior pharynx with post nasal drip and mild erythema. Isolated ulcer left  posterior pharynx, without discharge or secondary infection. No peritonsillar abscess or tonsillar exudate. Neck: Supple. No thyromegaly. Full ROM. <2 cm AC. Lungs: Clear bilaterally to auscultation without wheezes, rales, or rhonchi. Breathing is unlabored. Heart: RRR with S1 S2. No murmurs, rubs, or gallops appreciated. Abdomen: Soft, non-tender, non-distended with positive bowel sounds. Mild splenomegaly without hepatomegaly. No abdominal masses or bruits. Msk:  Strength and  tone normal for age. Extremities: No clubbing or cyanosis. No edema. Neuro: Alert and oriented X 3. Moves all extremities spontaneously. CNII-XII grossly in tact. Psych:  Responds to questions appropriately with a normal affect.   Labs: Results for orders placed in visit on 06/07/11  POCT CBC      Component Value Range   WBC 18.9 (*) 4.6 - 10.2 (K/uL)   Lymph, poc 13.3 (*) 0.6 - 3.4    POC LYMPH PERCENT 70.6 (*) 10 - 50 (%L)   MID (cbc) 2.9 (*) 0 - 0.9    POC MID % 15.6 (*) 0 - 12 (%M)   POC Granulocyte 2.6  2 - 6.9    Granulocyte percent 13.8 (*) 37 - 80 (%G)   RBC 4.67 (*) 4.69 - 6.13 (M/uL)   Hemoglobin 13.9 (*) 14.1 - 18.1 (g/dL)   HCT, POC 45.4 (*) 09.8 - 53.7 (%)   MCV 90.2  80 - 97 (fL)   MCH, POC 29.8  27 - 31.2 (pg)   MCHC 33.0  31.8 - 35.4 (g/dL)   RDW, POC 11.9     Platelet Count, POC 113 (*) 142 - 424 (K/uL)   MPV 12.0  0 - 99.8 (fL)  POCT RAPID STREP A (OFFICE)      Component Value Range   Rapid Strep A Screen Negative  Negative   POCT INFLUENZA A/B      Component Value Range   Influenza A, POC Negative     Influenza B, POC Negative    POCT UA - MICROSCOPIC ONLY      Component Value Range   WBC, Ur, HPF, POC 0-1     RBC, urine, microscopic negative     Bacteria, U Microscopic negative     Mucus, UA positive     Epithelial cells, urine per micros 0-1     Crystals, Ur, HPF, POC negative     Casts, Ur, LPF, POC negative     Yeast, UA negative    POCT URINALYSIS DIPSTICK      Component Value Range   Color, UA dark amber     Clarity, UA clear     Glucose, UA negative     Bilirubin, UA large     Ketones, UA trace     Spec Grav, UA 1.010     Blood, UA negative     pH, UA 5.5     Protein, UA 30     Urobilinogen, UA 4.0     Nitrite, UA negative     Leukocytes, UA Negative     CMP, EBV/CMV titers pending   [facility] reading (PRIMARY) by  Dr. Hal Hope. NAD.  ASSESSMENT AND PLAN:  21 y.o. year old male with Mono. -DMM -Supportive care -Tylenol/Motrin  prn -Rest/fluids -RTC precautions/ER precautions -Await labs -RTC 3 days to recheck blood work  Signed, [employer], PA-C 06/07/2011 4:11 PM

## 2011-06-08 LAB — CYTOMEGALOVIRUS ANTIBODY, IGG: Cytomegalovirus Ab-IgG: >6.5 — ABNORMAL HIGH (ref <0.90)

## 2011-06-08 LAB — EPSTEIN-BARR VIRUS VCA ANTIBODY PANEL: EBV EA IgG: 0.72 {ISR}

## 2011-06-08 NOTE — Progress Notes (Deleted)
  Subjective:    Patient ID: Jonathon Buchanan, male    DOB: [birth year], 21 y.o.   MRN: [id number]  HPI    Review of Systems     Objective:   Physical Exam        Assessment & Plan:

## 2011-06-10 ENCOUNTER — Ambulatory Visit (INDEPENDENT_AMBULATORY_CARE_PROVIDER_SITE_OTHER): Payer: 59 | Admitting: Physician Assistant

## 2011-06-10 VITALS — BP 123/90 | HR 88 | Temp 99.4°F | Resp 18 | Ht 73.0 in | Wt 301.0 lb

## 2011-06-10 DIAGNOSIS — D7289 Other specified disorders of white blood cells: Secondary | ICD-10-CM

## 2011-06-10 DIAGNOSIS — B2790 Infectious mononucleosis, unspecified without complication: Secondary | ICD-10-CM

## 2011-06-10 DIAGNOSIS — R945 Abnormal results of liver function studies: Secondary | ICD-10-CM

## 2011-06-10 DIAGNOSIS — R5381 Other malaise: Secondary | ICD-10-CM

## 2011-06-10 LAB — COMPREHENSIVE METABOLIC PANEL
AST: 230 U/L — ABNORMAL HIGH (ref 0–37)
BUN: 12 mg/dL (ref 6–23)
CO2: 24 mEq/L (ref 19–32)
Calcium: 9.4 mg/dL (ref 8.4–10.5)
Chloride: 103 mEq/L (ref 96–112)
Creat: 1.11 mg/dL (ref 0.50–1.35)
Glucose, Bld: 111 mg/dL — ABNORMAL HIGH (ref 70–99)

## 2011-06-10 LAB — POCT CBC
HCT, POC: 41.7 % — AB (ref 43.5–53.7)
MCH, POC: 29.6 pg (ref 27–31.2)
MCV: 90.6 fL (ref 80–97)
MID (cbc): 2.5 — AB (ref 0–0.9)
Platelet Count, POC: 146 10*3/uL (ref 142–424)
RBC: 4.6 M/uL — AB (ref 4.69–6.13)
WBC: 20.6 10*3/uL — AB (ref 4.6–10.2)

## 2011-06-10 LAB — CULTURE, GROUP A STREP: Organism ID, Bacteria: NORMAL

## 2011-06-10 MED ORDER — MAGIC MOUTHWASH W/LIDOCAINE: 5.0000 mL | Freq: Four times a day (QID) | ORAL | Status: DC | PRN

## 2011-06-10 NOTE — Progress Notes (Signed)
Patient ID: Jonathon Buchanan MRN: [id number], DOB: Jan 03, 1991, 21 y.o. Date of Encounter: 06/10/2011, 8:12 AM  Primary Physician: [name], PA, PA  Chief Complaint:  Chief Complaint  Patient presents with  . Fever    recheck on mono    HPI: 21 y.o. year old male presents for follow up of mononucleosis. Temperature running around 99-100. Sore throat persists. Ears feel muffled. No sinus pressure. Fatigue and appetite improving. Pushing fluids. No cough. Mild nasal congestion. No headache. No abdominal pain. Medications: Motrin prn for fever and myalgias. No GI symptoms. Positive sick contacts: work. No Recent antibiotics. Labs from 06/07/11 reviewed. Has not yet picked up [employer] magic Mouthwash. [facility] stated they did not receive the Rx.  No leg trauma, sedentary periods, h/o cancer, or tobacco use.  Past Medical History  Diagnosis Date  . GERD (gastroesophageal reflux disease)   . Hypothyroidism   . High triglycerides   . Castleman disease     Left axillary lymph node     Home Meds: Prior to Admission medications   Medication Sig Start Date End Date Taking? Authorizing Provider  levothyroxine (SYNTHROID, LEVOTHROID) 125 MCG tablet Take 125 mcg by mouth daily. AM   Yes Historical Provider, MD  Multiple Vitamins-Minerals (MULTIVITAMINS THER. W/MINERALS) TABS Take 1 tablet by mouth daily.     Yes Historical Provider, MD  omeprazole (PRILOSEC) 20 MG capsule Take 20 mg by mouth as needed.     Yes Historical Provider, MD  Alum & Mag Hydroxide-Simeth (MAGIC MOUTHWASH W/LIDOCAINE) SOLN Take 5 mLs by mouth 4 (four) times daily as needed (sore throat). 06/07/11   Eula Listen, PA-C    Allergies:  Allergies  Allergen Reactions  . Sulfa Antibiotics Rash    History   Social History  . Marital Status: Single    Spouse Name: [name]    Number of Children: [name]  . Years of Education: [name]   Occupational History  . Not on file.   Social History Main Topics  . Smoking status:  Former Smoker    Quit date: 06/07/2007  . Smokeless tobacco: Current User    Types: Snuff   Comment: quit smoking 1-2 mos. ago  . Alcohol Use: Yes     occasionally  . Drug Use: No  . Sexually Active: Not on file   Other Topics Concern  . Not on file   Social History Narrative  . No narrative on file     Review of Systems: Constitutional: negative for night sweats or weight changes Cardiovascular: negative for chest pain or palpitations Respiratory: negative for hemoptysis, wheezing, or shortness of breath Abdominal: negative for abdominal pain, nausea, vomiting or diarrhea Dermatological: negative for rash Neurologic: negative for headache   Physical Exam: Blood pressure 123/90, pulse 88, temperature 99.4 F (37.4 C), temperature source Oral, resp. rate 18, height 6\' 1"  (1.854 m), weight 301 lb (136.533 kg)., Body mass index is 39.71 kg/(m^2). General: Well developed, well nourished, in no acute distress. Head: Normocephalic, atraumatic, eyes without discharge, sclera non-icteric, nares are congested. Bilateral auditory canals clear, TM's are without perforation, pearly grey with reflective cone of light bilaterally. Oral cavity moist, dentition normal. Posterior pharynx with post nasal drip and mild erythema. No peritonsillar abscess or tonsillar exudate. Neck: Supple. No thyromegaly. Full ROM. <2 cm PC Lungs: Clear bilaterally to auscultation without wheezes, rales, or rhonchi. Breathing is unlabored. Heart: RRR with S1 S2. No murmurs, rubs, or gallops appreciated. Abdomen: Soft, non-tender, non-distended with normoactive bowel  sounds. No hepatosplenomegaly. No rebound/guarding. No obvious abdominal masses. Msk:  Strength and tone normal for age. Extremities: No clubbing or cyanosis. No edema. Neuro: Alert and oriented X 3. Moves all extremities spontaneously. CNII-XII grossly in tact. Psych:  Responds to questions appropriately with a normal affect.   Labs: Results for  orders placed in visit on 06/10/11  POCT CBC      Component Value Range   WBC 20.6 (*) 4.6 - 10.2 (K/uL)   Lymph, poc 14.2 (*) 0.6 - 3.4    POC LYMPH PERCENT 69.1 (*) 10 - 50 (%L)   MID (cbc) 2.5 (*) 0 - 0.9    POC MID % 12.1 (*) 0 - 12 (%M)   POC Granulocyte 3.9  2 - 6.9    Granulocyte percent 18.8 (*) 37 - 80 (%G)   RBC 4.60 (*) 4.69 - 6.13 (M/uL)   Hemoglobin 13.6 (*) 14.1 - 18.1 (g/dL)   HCT, POC 40.9 (*) 81.1 - 53.7 (%)   MCV 90.6  80 - 97 (fL)   MCH, POC 29.6  27 - 31.2 (pg)   MCHC 32.6  31.8 - 35.4 (g/dL)   RDW, POC 91.4     Platelet Count, POC 146  142 - 424 (K/uL)   MPV 10.6  0 - 99.8 (fL)   CMP pending  ASSESSMENT AND PLAN:  21 y.o. year old male with mononucleosis/leukocytosis/pharyngitis -Rewrite [employer] Magic Mouthwash -Supportive care -No Tylenol -Motrin prn -Rest/fluids -RTC precautions -RTC 24 hours for recheck -Await labs  Signed, Eula Listen, PA-C 06/10/2011 8:12 AM

## 2011-06-11 ENCOUNTER — Telehealth: Payer: Self-pay

## 2011-06-11 ENCOUNTER — Ambulatory Visit (INDEPENDENT_AMBULATORY_CARE_PROVIDER_SITE_OTHER): Payer: 59 | Admitting: Physician Assistant

## 2011-06-11 VITALS — BP 139/77 | HR 111 | Temp 101.7°F | Resp 18 | Ht 73.0 in | Wt 298.8 lb

## 2011-06-11 DIAGNOSIS — D7289 Other specified disorders of white blood cells: Secondary | ICD-10-CM

## 2011-06-11 DIAGNOSIS — B2790 Infectious mononucleosis, unspecified without complication: Secondary | ICD-10-CM

## 2011-06-11 DIAGNOSIS — R5383 Other fatigue: Secondary | ICD-10-CM

## 2011-06-11 LAB — PROTIME-INR: INR: 0.99 (ref <1.50)

## 2011-06-11 LAB — POCT CBC
Lymph, poc: 11.7 — AB (ref 0.6–3.4)
MCH, POC: 29.7 pg (ref 27–31.2)
MCHC: 32.9 g/dL (ref 31.8–35.4)
MCV: 90.2 fL (ref 80–97)
MID (cbc): 2.4 — AB (ref 0–0.9)
POC LYMPH PERCENT: 62.6 %L — AB (ref 10–50)
Platelet Count, POC: 151 10*3/uL (ref 142–424)
RDW, POC: 14 %
WBC: 18.7 10*3/uL — AB (ref 4.6–10.2)

## 2011-06-11 LAB — COMPREHENSIVE METABOLIC PANEL
AST: 144 U/L — ABNORMAL HIGH (ref 0–37)
Alkaline Phosphatase: 157 U/L — ABNORMAL HIGH (ref 39–117)
BUN: 14 mg/dL (ref 6–23)
Calcium: 9.1 mg/dL (ref 8.4–10.5)
Creat: 1.11 mg/dL (ref 0.50–1.35)
Glucose, Bld: 109 mg/dL — ABNORMAL HIGH (ref 70–99)

## 2011-06-11 NOTE — Telephone Encounter (Signed)
LMOM to CB for results

## 2011-06-11 NOTE — Patient Instructions (Signed)
Infectious Mononucleosis Infectious mononucleosis (mono) is a common germ (viral) infection in children, teenagers, and young adults.  CAUSES  Mono is an infection caused by the Epstein Barr virus. The virus is spread by close personal contact with someone who has the infection. It can be passed by contact with your saliva through things such as kissing or sharing drinking glasses. Sometimes, the infection can be spread from someone who does not appear sick but still spreads the virus (asymptomatic carrier state).  SYMPTOMS  The most common symptoms of Mono are:  Sore throat.   Headache.   Fatigue.   Muscle aches.   Swollen glands.   Fever.   Poor appetite.   Enlarged liver or spleen.  The less common symptoms can include:  Rash.   Feeling sick to your stomach (nauseous).   Abdominal pain.  DIAGNOSIS  Mono is diagnosed by a blood test.  TREATMENT  Treatment of mono is usually at home. There is no medicine that cures this virus. Sometimes hospital treatment is needed in severe cases. Steroid medicine sometimes is needed if the swelling in the throat causes breathing or swallowing problems.  HOME CARE INSTRUCTIONS   Drink enough fluids to keep your urine clear or pale yellow.   Eat soft foods. Cool foods like popsicles or ice cream can soothe a sore throat.   Only take over-the-counter or prescription medicines for pain, discomfort, or fever as directed by your caregiver. Children under 18 years of age should not take aspirin.   Gargle salt water. This may help relieve your sore throat. Put 1 teaspoon (tsp) of salt in 1 cup of warm water. Sucking on hard candy may also help.   Rest as needed.   Start regular activities gradually after the fever is gone. Be sure to rest when tired.   Avoid strenuous exercise or contact sports until your caregiver says it is okay. The liver and spleen could be seriously injured.   Avoid sharing drinking glasses or kissing until your  caregiver tells you that you are no longer contagious.  SEEK MEDICAL CARE IF:   Your fever is not gone after 7 days.   Your activity level is not back to normal after 2 weeks.   You have yellow coloring to eyes and skin (jaundice).  SEEK IMMEDIATE MEDICAL CARE IF:   You have severe pain in the abdomen or shoulder.   You have trouble swallowing or drooling.   You have trouble breathing.   You develop a stiff neck.   You develop a severe headache.   You cannot stop throwing up (vomiting).   You have convulsions.   You are confused.   You have trouble with balance.   You develop signs of body fluid loss (dehydration):   Weakness.   Sunken eyes.   Pale skin.   Dry mouth.   Rapid breathing or pulse.  MAKE SURE YOU:   Understand these instructions.   Will watch your condition.   Will get help right away if you are not doing well or get worse.  Document Released: 04/07/2000 Document Revised: 12/21/2010 Document Reviewed: 02/04/2008 Exit[employer] Patient Information 2012 [employer]. 

## 2011-06-11 NOTE — Telephone Encounter (Signed)
Message copied by Cydney Ok on Sun Jun 11, 2011  2:34 PM ------      Message from: Sondra Barges      Created: Sun Jun 11, 2011  2:30 PM       Please call patient. Labs continue to improve. LFT's down to 144 and 280. Bili down to 1.6. Let's plan to recheck these on 2/18 at 730 AM, with the plan to recheck after that in 48 hours.            Ryan

## 2011-06-11 NOTE — Progress Notes (Signed)
Patient ID: Jonathon Buchanan MRN: [id number], DOB: 12/30/1990, 21 y.o. Date of Encounter: 06/11/2011, 7:54 AM  Primary Physician: [name], PA, PA  Chief Complaint: No chief complaint on file.   HPI: 21 y.o. year old male presents for follow up of mononucleosis. Temperature running around 99-100. Sore throat improving. No headache. Ears not as muffled. No sinus pressure. Fatigue and appetite improving. Pushing fluids. No cough. Mild nasal congestion. No abdominal pain. Taking Motrin as needed for fever. Labs from 06/10/11 reviewed. No Tylenol or ETOH.  No leg trauma, sedentary periods, h/o cancer, or tobacco use. [phone]  Past Medical History  Diagnosis Date  . GERD (gastroesophageal reflux disease)   . Hypothyroidism   . High triglycerides   . Castleman disease     Left axillary lymph node     Home Meds: Prior to Admission medications   Medication Sig Start Date End Date Taking? Authorizing Provider  Alum & Mag Hydroxide-Simeth (MAGIC MOUTHWASH W/LIDOCAINE) SOLN Take 5 mLs by mouth 4 (four) times daily as needed (sore throat). 06/10/11   Roza Creamer, PA-C  levothyroxine (SYNTHROID, LEVOTHROID) 125 MCG tablet Take 125 mcg by mouth daily. AM    Historical Provider, MD  Multiple Vitamins-Minerals (MULTIVITAMINS THER. W/MINERALS) TABS Take 1 tablet by mouth daily.      Historical Provider, MD  omeprazole (PRILOSEC) 20 MG capsule Take 20 mg by mouth as needed.      Historical Provider, MD    Allergies:  Allergies  Allergen Reactions  . Sulfa Antibiotics Rash    History   Social History  . Marital Status: Single    Spouse Name: [name]    Number of Children: [name]  . Years of Education: [name]   Occupational History  . Not on file.   Social History Main Topics  . Smoking status: Former Smoker    Quit date: 06/07/2007  . Smokeless tobacco: Current User    Types: Snuff   Comment: quit smoking 1-2 mos. ago  . Alcohol Use: Yes     occasionally  . Drug Use: No  .  Sexually Active: Not on file   Other Topics Concern  . Not on file   Social History Narrative  . No narrative on file     Review of Systems: Constitutional: negative for night sweats or weight changes Cardiovascular: negative for chest pain or palpitations Respiratory: negative for hemoptysis, wheezing, or shortness of breath Abdominal: negative for abdominal pain, nausea, vomiting or diarrhea Dermatological: negative for rash Neurologic: negative for headache   Physical Exam: Blood pressure 139/77, pulse 111, temperature 101.7 F (38.7 C), temperature source Oral, resp. rate 18, height 6\' 1"  (1.854 m), weight 298 lb 12.8 oz (135.535 kg)., Body mass index is 39.42 kg/(m^2). General: Well developed, well nourished, in no acute distress. Head: Normocephalic, atraumatic, eyes without discharge, sclera non-icteric, nares are congested. Bilateral auditory canals clear, TM's are without perforation, pearly grey with reflective cone of light bilaterally. Oral cavity moist, dentition normal. Posterior pharynx with post nasal drip and mild erythema. No peritonsillar abscess or tonsillar exudate. Neck: Supple. No thyromegaly. Full ROM. <2 cm PC Lungs: Clear bilaterally to auscultation without wheezes, rales, or rhonchi. Breathing is unlabored. Heart: RRR with S1 S2. No murmurs, rubs, or gallops appreciated. Abdomen: Soft, non-tender, non-distended with normoactive bowel sounds. No hepatosplenomegaly. No rebound/guarding. No obvious abdominal masses. Msk:  Strength and tone normal for age. Extremities: No clubbing or cyanosis. No edema. Neuro: Alert and oriented X 3. Moves  all extremities spontaneously. CNII-XII grossly in tact. Psych:  Responds to questions appropriately with a normal affect.   Labs: Results for orders placed in visit on 06/11/11  POCT CBC      Component Value Range   WBC 18.7 (*) 4.6 - 10.2 (K/uL)   Lymph, poc 11.7 (*) 0.6 - 3.4    POC LYMPH PERCENT 62.6 (*) 10 - 50 (%L)    MID (cbc) 2.4 (*) 0 - 0.9    POC MID % 13.0 (*) 0 - 12 (%M)   POC Granulocyte 4.6  2 - 6.9    Granulocyte percent 24.4 (*) 37 - 80 (%G)   RBC 4.55 (*) 4.69 - 6.13 (M/uL)   Hemoglobin 13.5 (*) 14.1 - 18.1 (g/dL)   HCT, POC 16.1 (*) 09.6 - 53.7 (%)   MCV 90.2  80 - 97 (fL)   MCH, POC 29.7  27 - 31.2 (pg)   MCHC 32.9  31.8 - 35.4 (g/dL)   RDW, POC 04.5     Platelet Count, POC 151  142 - 424 (K/uL)   MPV 9.9  0 - 99.8 (fL)   CMP and PT/INR pending.  ASSESSMENT AND PLAN:  21 y.o. year old male with infectious mononucleosis. -Continue [employer] Magic Mouthwash Supportive care -Mucinex -Motrin prn -No Tylenol or ETOH -Rest/fluids -RTC precautions -Recheck pending labs.   SignedEula Listen, PA-C 06/11/2011 7:54 AM

## 2011-06-11 NOTE — Telephone Encounter (Signed)
Spoke with pt and gave him results from [place]. Pt agreed to come in tomorrow in the am to recheck

## 2011-06-12 ENCOUNTER — Ambulatory Visit (INDEPENDENT_AMBULATORY_CARE_PROVIDER_SITE_OTHER): Payer: 59 | Admitting: Physician Assistant

## 2011-06-12 VITALS — BP 134/80 | HR 94 | Temp 100.1°F | Resp 16 | Ht 75.0 in | Wt 295.8 lb

## 2011-06-12 DIAGNOSIS — D7289 Other specified disorders of white blood cells: Secondary | ICD-10-CM

## 2011-06-12 DIAGNOSIS — R5381 Other malaise: Secondary | ICD-10-CM

## 2011-06-12 DIAGNOSIS — B2790 Infectious mononucleosis, unspecified without complication: Secondary | ICD-10-CM

## 2011-06-12 LAB — POCT CBC
Hemoglobin: 13.3 g/dL — AB (ref 14.1–18.1)
Lymph, poc: 10.4 — AB (ref 0.6–3.4)
MCH, POC: 29.5 pg (ref 27–31.2)
MCHC: 32.8 g/dL (ref 31.8–35.4)
MCV: 90.1 fL (ref 80–97)
MID (cbc): 1.9 — AB (ref 0–0.9)
MPV: 9.9 fL (ref 0–99.8)
POC MID %: 12 %M (ref 0–12)
WBC: 16.1 10*3/uL — AB (ref 4.6–10.2)

## 2011-06-12 LAB — COMPREHENSIVE METABOLIC PANEL
ALT: 210 U/L — ABNORMAL HIGH (ref 0–53)
BUN: 15 mg/dL (ref 6–23)
CO2: 25 mEq/L (ref 19–32)
Calcium: 9.1 mg/dL (ref 8.4–10.5)
Chloride: 103 mEq/L (ref 96–112)
Creat: 0.97 mg/dL (ref 0.50–1.35)
Total Bilirubin: 1.4 mg/dL — ABNORMAL HIGH (ref 0.3–1.2)

## 2011-06-12 NOTE — Progress Notes (Signed)
Patient ID: Jonathon Buchanan MRN: [id number], DOB: [birth year], 21 y.o. Date of Encounter: 06/12/2011, 8:03 AM  Primary Physician: [name], PA, PA  Chief Complaint:  Chief Complaint  Patient presents with  . Labs Only    labs - recheck mono    HPI: 21 y.o. year old male presents for follow up of mononucleosis. Did develop a temperature of 101 around 6-7 PM the previous evening. With this fever he noted a return of the dull headache. His headache resolved with 400 mg of Motrin, along with the fever also. Overall he continues to feel like he is improving. His appetite, energy, and sore throat continue to improve daily. No cough, abdominal pain, nasal congestion, sinus pressure, nausea, vomiting, or diarrhea. He is eating ok, and pushing fluids. No Tylenol or ETOH. Labs reviewed. Since his presentation on 2/13, he has lost a total of 9 pounds to date. 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   No leg trauma, sedentary periods, h/o cancer, or tobacco use.  Past Medical History  Diagnosis Date  . GERD (gastroesophageal reflux disease)   . Hypothyroidism   . High triglycerides   . Castleman disease     Left axillary lymph node     Home Meds: Prior to Admission medications   Medication Sig Start Date End Date Taking? Authorizing Provider  Alum & Mag Hydroxide-Simeth (MAGIC MOUTHWASH W/LIDOCAINE) SOLN Take 5 mLs by mouth 4 (four) times daily as needed (sore throat). 06/10/11  Yes Elmon Shader, PA-C  fish oil-omega-3 fatty acids 1000 MG capsule Take 1 g by mouth daily.   Yes Historical Provider, MD  levothyroxine (SYNTHROID, LEVOTHROID) 125 MCG tablet Take 125 mcg by mouth daily. AM   Yes Historical Provider, MD  omeprazole (PRILOSEC) 20 MG capsule Take 20 mg by mouth as needed.     Yes Historical Provider, MD  Multiple Vitamins-Minerals (MULTIVITAMINS THER. W/MINERALS) TABS Take 1 tablet by mouth daily.      Historical Provider, MD     Allergies:  Allergies  Allergen Reactions  . Sulfa Antibiotics Rash    History   Social History  . Marital Status: Single    Spouse Name: [name]    Number of Children: [name]  . Years of Education: [name]   Occupational History  . Not on file.   Social History Main Topics  . Smoking status: Former Smoker    Quit date: 06/07/2007  . Smokeless tobacco: Current User    Types: Snuff   Comment: quit smoking 1-2 mos. ago  . Alcohol Use: Yes     occasionally  . Drug Use: No  . Sexually Active: Not on file   Other Topics Concern  . Not on file   Social History Narrative  . No narrative on file     Review of Systems: Constitutional: negative for night sweats Cardiovascular: negative for chest pain or palpitations Respiratory: negative for hemoptysis, wheezing, or shortness of breath Abdominal: negative for abdominal pain, nausea, vomiting or diarrhea Dermatological: negative for rash Neurologic: negative for headache   Physical Exam: Blood pressure 134/80, pulse 94, temperature 100.1 F (37.8 C), temperature source Oral, resp. rate 16, height 6\' 3"  (1.905 m), weight 295 lb 12.8 oz (134.174 kg)., Body mass index is 36.97 kg/(m^2). General: Well developed, well nourished, in no acute distress. Well appearing. Head: Normocephalic, atraumatic, eyes without discharge, sclera non-icteric, nares are congested. Bilateral auditory canals clear, TM's are without perforation,  pearly grey with reflective cone of light bilaterally. Oral cavity moist, dentition normal. Posterior pharynx with post nasal drip and mild erythema. No peritonsillar abscess or tonsillar exudate. Neck: Supple. No thyromegaly. Full ROM. < 2 cm PC. Lungs: Clear bilaterally to auscultation without wheezes, rales, or rhonchi. Breathing is unlabored. Heart: RRR with S1 S2. No murmurs, rubs, or gallops appreciated. Abdomen: Soft, non-tender, non-distended with normoactive bowel sounds. No hepatosplenomegaly. No  rebound/guarding. No obvious abdominal masses. Msk:  Strength and tone normal for age. Extremities: No clubbing or cyanosis. No edema. Neuro: Alert and oriented X 3. Moves all extremities spontaneously. CNII-XII grossly in tact. Psych:  Responds to questions appropriately with a normal affect.   Labs: Results for orders placed in visit on 06/12/11  POCT CBC      Component Value Range   WBC 16.1 (*) 4.6 - 10.2 (K/uL)   Lymph, poc 10.4 (*) 0.6 - 3.4    POC LYMPH PERCENT 64.7 (*) 10 - 50 (%L)   MID (cbc) 1.9 (*) 0 - 0.9    POC MID % 12.0  0 - 12 (%M)   POC Granulocyte 3.8  2 - 6.9    Granulocyte percent 23.3 (*) 37 - 80 (%G)   RBC 4.51 (*) 4.69 - 6.13 (M/uL)   Hemoglobin 13.3 (*) 14.1 - 18.1 (g/dL)   HCT, POC 16.1 (*) 09.6 - 53.7 (%)   MCV 90.1  80 - 97 (fL)   MCH, POC 29.5  27 - 31.2 (pg)   MCHC 32.8  31.8 - 35.4 (g/dL)   RDW, POC 04.5     Platelet Count, POC 174  142 - 424 (K/uL)   MPV 9.9  0 - 99.8 (fL)    CMP pending  ASSESSMENT AND PLAN:  21 y.o. year old male with infectious mononucleosis. -Continue DMM -Supportive care -Mucinex -Motrin prn -No Tylenol or ETOH -Rest/fluids -RTC precautions -Recheck 3 days, sooner if worse -Continue EBV precautions  Signed, Eula Listen, PA-C 06/12/2011 8:03 AM

## 2011-06-12 NOTE — Patient Instructions (Signed)
Infectious Mononucleosis Infectious mononucleosis (mono) is a common germ (viral) infection in children, teenagers, and young adults.  CAUSES  Mono is an infection caused by the Epstein Barr virus. The virus is spread by close personal contact with someone who has the infection. It can be passed by contact with your saliva through things such as kissing or sharing drinking glasses. Sometimes, the infection can be spread from someone who does not appear sick but still spreads the virus (asymptomatic carrier state).  SYMPTOMS  The most common symptoms of Mono are:  Sore throat.   Headache.   Fatigue.   Muscle aches.   Swollen glands.   Fever.   Poor appetite.   Enlarged liver or spleen.  The less common symptoms can include:  Rash.   Feeling sick to your stomach (nauseous).   Abdominal pain.  DIAGNOSIS  Mono is diagnosed by a blood test.  TREATMENT  Treatment of mono is usually at home. There is no medicine that cures this virus. Sometimes hospital treatment is needed in severe cases. Steroid medicine sometimes is needed if the swelling in the throat causes breathing or swallowing problems.  HOME CARE INSTRUCTIONS   Drink enough fluids to keep your urine clear or pale yellow.   Eat soft foods. Cool foods like popsicles or ice cream can soothe a sore throat.   Only take over-the-counter or prescription medicines for pain, discomfort, or fever as directed by your caregiver. Children under 18 years of age should not take aspirin.   Gargle salt water. This may help relieve your sore throat. Put 1 teaspoon (tsp) of salt in 1 cup of warm water. Sucking on hard candy may also help.   Rest as needed.   Start regular activities gradually after the fever is gone. Be sure to rest when tired.   Avoid strenuous exercise or contact sports until your caregiver says it is okay. The liver and spleen could be seriously injured.   Avoid sharing drinking glasses or kissing until your  caregiver tells you that you are no longer contagious.  SEEK MEDICAL CARE IF:   Your fever is not gone after 7 days.   Your activity level is not back to normal after 2 weeks.   You have yellow coloring to eyes and skin (jaundice).  SEEK IMMEDIATE MEDICAL CARE IF:   You have severe pain in the abdomen or shoulder.   You have trouble swallowing or drooling.   You have trouble breathing.   You develop a stiff neck.   You develop a severe headache.   You cannot stop throwing up (vomiting).   You have convulsions.   You are confused.   You have trouble with balance.   You develop signs of body fluid loss (dehydration):   Weakness.   Sunken eyes.   Pale skin.   Dry mouth.   Rapid breathing or pulse.  MAKE SURE YOU:   Understand these instructions.   Will watch your condition.   Will get help right away if you are not doing well or get worse.  Document Released: 04/07/2000 Document Revised: 12/21/2010 Document Reviewed: 02/04/2008 Exit[employer] Patient Information 2012 [employer]. 

## 2011-06-15 ENCOUNTER — Ambulatory Visit (INDEPENDENT_AMBULATORY_CARE_PROVIDER_SITE_OTHER): Payer: 59 | Admitting: Physician Assistant

## 2011-06-15 VITALS — BP 124/77 | HR 77 | Temp 98.7°F | Resp 16 | Ht 74.0 in | Wt 297.8 lb

## 2011-06-15 DIAGNOSIS — B2790 Infectious mononucleosis, unspecified without complication: Secondary | ICD-10-CM

## 2011-06-15 DIAGNOSIS — D7289 Other specified disorders of white blood cells: Secondary | ICD-10-CM

## 2011-06-15 DIAGNOSIS — R5381 Other malaise: Secondary | ICD-10-CM

## 2011-06-15 LAB — POCT CBC
Lymph, poc: 6.3 — AB (ref 0.6–3.4)
MCH, POC: 29 pg (ref 27–31.2)
MCHC: 32 g/dL (ref 31.8–35.4)
MCV: 90.6 fL (ref 80–97)
MID (cbc): 0.8 (ref 0–0.9)
MPV: 9.5 fL (ref 0–99.8)
POC LYMPH PERCENT: 70 %L — AB (ref 10–50)
POC MID %: 9.3 %M (ref 0–12)
Platelet Count, POC: 240 10*3/uL (ref 142–424)
RBC: 4.52 M/uL — AB (ref 4.69–6.13)
RDW, POC: 13.7 %
WBC: 9 10*3/uL (ref 4.6–10.2)

## 2011-06-15 NOTE — Progress Notes (Signed)
Patient ID: Jonathon Buchanan MRN: [id number], DOB: 01-Nov-1990, 21 y.o. Date of Encounter: 06/15/2011, 2:07 PM  Primary Physician: [name], PA, PA  Chief Complaint:  Chief Complaint  Patient presents with  . Follow-up    mono- feeling better!    HPI: 21 y.o. year old male presents for follow up of infectious mononucleosis. Feel "a lot better." Afebrile for the past 24+ hours without the aid of Motrin. No further sore throat or headache. Energy is 80% back. Appetite is returning. Has increased his activity around the house without difficulty or fatigue. No cough, abdominal pain, nasal congestion, sinus pressure, nausea, vomiting, or diarrhea. No Tylenol or ETOH. Labs reviewed and continue to improve. Has been out of work since presentation on 2/13 for mono precautions.   No leg trauma, sedentary periods, h/o cancer, or tobacco use.  Past Medical History  Diagnosis Date  . GERD (gastroesophageal reflux disease)   . Hypothyroidism   . High triglycerides   . Castleman disease     Left axillary lymph node     Home Meds: Prior to Admission medications   Medication Sig Start Date End Date Taking? Authorizing Provider  fish oil-omega-3 fatty acids 1000 MG capsule Take 1 g by mouth daily.   Yes Historical Provider, MD  levothyroxine (SYNTHROID, LEVOTHROID) 125 MCG tablet Take 125 mcg by mouth daily. AM   Yes Historical Provider, MD  Multiple Vitamins-Minerals (MULTIVITAMINS THER. W/MINERALS) TABS Take 1 tablet by mouth daily.     Yes Historical Provider, MD  omeprazole (PRILOSEC) 20 MG capsule Take 20 mg by mouth as needed.     Yes Historical Provider, MD  Alum & Mag Hydroxide-Simeth (MAGIC MOUTHWASH W/LIDOCAINE) SOLN Take 5 mLs by mouth 4 (four) times daily as needed (sore throat). 06/10/11   Eula Listen, PA-C    Allergies:  Allergies  Allergen Reactions  . Sulfa Antibiotics Rash    History   Social History  . Marital Status: Single    Spouse Name: [name]    Number of  Children: [name]  . Years of Education: [name]   Occupational History  . Not on file.   Social History Main Topics  . Smoking status: Former Smoker    Quit date: 06/07/2007  . Smokeless tobacco: Current User    Types: Snuff   Comment: quit smoking 1-2 mos. ago  . Alcohol Use: Yes     occasionally  . Drug Use: No  . Sexually Active: Not on file   Other Topics Concern  . Not on file   Social History Narrative  . No narrative on file     Review of Systems: Constitutional: negative for chills, fever, night sweats or weight changes Cardiovascular: negative for chest pain or palpitations Respiratory: negative for hemoptysis, wheezing, or shortness of breath Abdominal: negative for abdominal pain, nausea, vomiting or diarrhea Dermatological: negative for rash Neurologic: negative for headache   Physical Exam: Blood pressure 124/77, pulse 77, temperature 98.7 F (37.1 C), temperature source Oral, resp. rate 16, height 6\' 2"  (1.88 m), weight 297 lb 12.8 oz (135.081 kg)., Body mass index is 38.24 kg/(m^2). General: Well developed, well nourished, in no acute distress. Head: Normocephalic, atraumatic, eyes without discharge, sclera non-icteric, nares are clear bilaterally. Bilateral auditory canals clear, TM's are without perforation, pearly grey with reflective cone of light bilaterally. Oral cavity moist, dentition normal. Posterior pharynx without post nasal drip or erythema. No peritonsillar abscess or tonsillar exudate. Neck: Supple. No thyromegaly. Full ROM. No  lymphadenopathy. Lungs: Clear bilaterally to auscultation without wheezes, rales, or rhonchi. Breathing is unlabored. Heart: RRR with S1 S2. No murmurs, rubs, or gallops appreciated. Abdomen: Soft, non-tender, non-distended with normoactive bowel sounds. No hepatosplenomegaly. No rebound/guarding. No obvious abdominal masses. Msk:  Strength and tone normal for age. Extremities: No clubbing or cyanosis. No edema. Neuro: Alert  and oriented X 3. Moves all extremities spontaneously. CNII-XII grossly in tact. Psych:  Responds to questions appropriately with a normal affect.   Labs: Results for orders placed in visit on 06/15/11  POCT CBC      Component Value Range   WBC 9.0  4.6 - 10.2 (K/uL)   Lymph, poc 6.3 (*) 0.6 - 3.4    POC LYMPH PERCENT 70.0 (*) 10 - 50 (%L)   MID (cbc) 0.8  0 - 0.9    POC MID % 9.3  0 - 12 (%M)   POC Granulocyte 1.9 (*) 2 - 6.9    Granulocyte percent 20.7 (*) 37 - 80 (%G)   RBC 4.52 (*) 4.69 - 6.13 (M/uL)   Hemoglobin 13.1 (*) 14.1 - 18.1 (g/dL)   HCT, POC 16.1 (*) 09.6 - 53.7 (%)   MCV 90.6  80 - 97 (fL)   MCH, POC 29.0  27 - 31.2 (pg)   MCHC 32.0  31.8 - 35.4 (g/dL)   RDW, POC 04.5     Platelet Count, POC 240  142 - 424 (K/uL)   MPV 9.5  0 - 99.8 (fL)   CMP pending  ASSESSMENT AND PLAN:  21 y.o. year old male with improving infectious mononucleosis -Improving -Cleared for light duty -Rest as needed -Mucinex -Motrin prn -Avoid Tylenol/ETOH -Fluids -RTC precautions -RTC 6 days   Signed, Eula Listen, PA-C 06/15/2011 2:07 PM

## 2011-06-16 LAB — COMPREHENSIVE METABOLIC PANEL
ALT: 102 U/L — ABNORMAL HIGH (ref 0–53)
AST: 38 U/L — ABNORMAL HIGH (ref 0–37)
Alkaline Phosphatase: 104 U/L (ref 39–117)
BUN: 13 mg/dL (ref 6–23)
Calcium: 9.8 mg/dL (ref 8.4–10.5)
Chloride: 103 mEq/L (ref 96–112)
Creat: 0.98 mg/dL (ref 0.50–1.35)
Total Bilirubin: 0.9 mg/dL (ref 0.3–1.2)

## 2011-06-21 ENCOUNTER — Ambulatory Visit (INDEPENDENT_AMBULATORY_CARE_PROVIDER_SITE_OTHER): Payer: 59 | Admitting: Physician Assistant

## 2011-06-21 VITALS — BP 133/82 | HR 84 | Temp 98.4°F | Resp 16 | Ht 75.0 in | Wt 299.2 lb

## 2011-06-21 DIAGNOSIS — B2790 Infectious mononucleosis, unspecified without complication: Secondary | ICD-10-CM

## 2011-06-21 DIAGNOSIS — D7289 Other specified disorders of white blood cells: Secondary | ICD-10-CM

## 2011-06-21 LAB — POCT CBC
Granulocyte percent: 38.4 %G (ref 37–80)
MCH, POC: 29.5 pg (ref 27–31.2)
MCV: 88.3 fL (ref 80–97)
MID (cbc): 0.6 (ref 0–0.9)
MPV: 8.8 fL (ref 0–99.8)
POC LYMPH PERCENT: 52.6 %L — AB (ref 10–50)
POC MID %: 9 %M (ref 0–12)
Platelet Count, POC: 320 10*3/uL (ref 142–424)
RBC: 4.82 M/uL (ref 4.69–6.13)
RDW, POC: 13 %

## 2011-06-21 NOTE — Progress Notes (Signed)
Patient ID: Jonathon Buchanan MRN: [id number], DOB: Jul 17, 1990, 21 y.o. Date of Encounter: 06/21/2011, 3:59 PM  Primary Physician: [name], PA, PA  Chief Complaint:  Chief Complaint  Patient presents with  . Elevated Hepatic Enzymes    blood work    HPI: 21 y.o. year old male presents for follow up of infectious mononucleosis. Feels '100% better." Has remained afebrile without chills. No sore throat, nasal congestion, sinus pressure, nausea, vomiting, diarrhea, headache, fatigue, or abdominal pain. Normal energy. Normal appetite. Tolerating light duty without issue. No Tylenol or alcohol. Labs reviewed.   No leg trauma, sedentary periods, h/o cancer, or tobacco use.  Past Medical History  Diagnosis Date  . GERD (gastroesophageal reflux disease)   . Hypothyroidism   . High triglycerides   . Castleman disease     Left axillary lymph node     Home Meds: Prior to Admission medications   Medication Sig Start Date End Date Taking? Authorizing Provider  fish oil-omega-3 fatty acids 1000 MG capsule Take 1 g by mouth daily.   Yes Historical Provider, MD  levothyroxine (SYNTHROID, LEVOTHROID) 125 MCG tablet Take 125 mcg by mouth daily. AM   Yes Historical Provider, MD  Multiple Vitamins-Minerals (MULTIVITAMINS THER. W/MINERALS) TABS Take 1 tablet by mouth daily.     Yes Historical Provider, MD  omeprazole (PRILOSEC) 20 MG capsule Take 20 mg by mouth as needed.     Yes Historical Provider, MD  Alum & Mag Hydroxide-Simeth (MAGIC MOUTHWASH W/LIDOCAINE) SOLN Take 5 mLs by mouth 4 (four) times daily as needed (sore throat). 06/10/11   Eula Listen, PA-C    Allergies:  Allergies  Allergen Reactions  . Sulfa Antibiotics Rash    History   Social History  . Marital Status: Single    Spouse Name: [name]    Number of Children: [name]  . Years of Education: [name]   Occupational History  . Not on file.   Social History Main Topics  . Smoking status: Former Smoker    Quit date:  06/07/2007  . Smokeless tobacco: Current User    Types: Snuff   Comment: quit smoking 1-2 mos. ago  . Alcohol Use: Yes     occasionally  . Drug Use: No  . Sexually Active: Not on file   Other Topics Concern  . Not on file   Social History Narrative  . No narrative on file     Review of Systems: Constitutional: negative for chills, fever, night sweats or weight changes Cardiovascular: negative for chest pain or palpitations Respiratory: negative for hemoptysis, wheezing, or shortness of breath Abdominal: negative for abdominal pain, nausea, vomiting or diarrhea Dermatological: negative for rash Neurologic: negative for headache   Physical Exam: Blood pressure 133/82, pulse 84, temperature 98.4 F (36.9 C), temperature source Oral, resp. rate 16, height 6\' 3"  (1.905 m), weight 299 lb 3.2 oz (135.716 kg)., Body mass index is 37.40 kg/(m^2). General: Well developed, well nourished, in no acute distress. Head: Normocephalic, atraumatic, eyes without discharge, sclera non-icteric, nares are congested. Bilateral auditory canals clear, TM's are without perforation, pearly grey with reflective cone of light bilaterally. No sinus TTP. Oral cavity moist, dentition normal. Posterior pharynx without post nasal drip, erythema, peritonsillar abscess, or tonsillar exudate. Neck: Supple. No thyromegaly. Full ROM. No lymphadenopathy. Lungs: Clear bilaterally to auscultation without wheezes, rales, or rhonchi. Breathing is unlabored. Heart: RRR with S1 S2. No murmurs, rubs, or gallops appreciated. Abdomen: Soft, non-tender, non-distended with normoactive bowel sounds. No  hepatosplenomegaly. No rebound/guarding. No obvious abdominal masses. Msk:  Strength and tone normal for age. Extremities: No clubbing or cyanosis. No edema. Neuro: Alert and oriented X 3. Moves all extremities spontaneously. CNII-XII grossly in tact. Psych:  Responds to questions appropriately with a normal affect.    Labs: Results for orders placed in visit on 06/21/11  POCT CBC      Component Value Range   WBC 7.2  4.6 - 10.2 (K/uL)   Lymph, poc 3.8 (*) 0.6 - 3.4    POC LYMPH PERCENT 52.6 (*) 10 - 50 (%L)   MID (cbc) 0.6  0 - 0.9    POC MID % 9.0  0 - 12 (%M)   POC Granulocyte 2.8  2 - 6.9    Granulocyte percent 38.4  37 - 80 (%G)   RBC 4.82  4.69 - 6.13 (M/uL)   Hemoglobin 14.2  14.1 - 18.1 (g/dL)   HCT, POC 21.3 (*) 08.6 - 53.7 (%)   MCV 88.3  80 - 97 (fL)   MCH, POC 29.5  27 - 31.2 (pg)   MCHC 33.3  31.8 - 35.4 (g/dL)   RDW, POC 57.8     Platelet Count, POC 320  142 - 424 (K/uL)   MPV 8.8  0 - 99.8 (fL)   CMP pending  ASSESSMENT AND PLAN:  21 y.o. year old male with resolved mononucleosis -Hold from full duty at work until 30 days out from illness -He will call with an update at that time -If labs are normal no further follow up needed -Mono precautions remain in place at this time -RTC precautions  Signed, Eula Listen, PA-C 06/21/2011 3:59 PM

## 2011-06-22 LAB — COMPREHENSIVE METABOLIC PANEL
ALT: 60 U/L — ABNORMAL HIGH (ref 0–53)
AST: 32 U/L (ref 0–37)
Alkaline Phosphatase: 78 U/L (ref 39–117)
BUN: 15 mg/dL (ref 6–23)
Creat: 1.04 mg/dL (ref 0.50–1.35)
Potassium: 4.7 mEq/L (ref 3.5–5.3)

## 2011-06-29 ENCOUNTER — Telehealth: Payer: Self-pay

## 2011-06-29 NOTE — Telephone Encounter (Signed)
Jonathon Buchanan  Pt would like to be released for the weekend Treated for mono  [phone]

## 2011-06-30 ENCOUNTER — Ambulatory Visit (INDEPENDENT_AMBULATORY_CARE_PROVIDER_SITE_OTHER): Payer: 59 | Admitting: Family Medicine

## 2011-06-30 VITALS — BP 158/71 | HR 67 | Temp 98.3°F | Resp 18 | Ht 74.25 in | Wt 299.4 lb

## 2011-06-30 DIAGNOSIS — B2790 Infectious mononucleosis, unspecified without complication: Secondary | ICD-10-CM

## 2011-06-30 LAB — POCT CBC
Granulocyte percent: 43 %G (ref 37–80)
HCT, POC: 41.1 % — AB (ref 43.5–53.7)
Hemoglobin: 13.7 g/dL — AB (ref 14.1–18.1)
Lymph, poc: 2.6 (ref 0.6–3.4)
MCH, POC: 29.4 pg (ref 27–31.2)
MCHC: 33.3 g/dL (ref 31.8–35.4)
MCV: 88.3 fL (ref 80–97)
MID (cbc): 0.4 (ref 0–0.9)
MPV: 9.3 fL (ref 0–99.8)
POC Granulocyte: 2.3 (ref 2–6.9)
POC LYMPH PERCENT: 49.7 %L (ref 10–50)
POC MID %: 7.3 %M (ref 0–12)
Platelet Count, POC: 240 10*3/uL (ref 142–424)
RBC: 4.56 M/uL — AB (ref 4.69–6.13)
RDW, POC: 12.9 %
WBC: 5.3 10*3/uL (ref 4.6–10.2)

## 2011-06-30 NOTE — Telephone Encounter (Signed)
Please have him fast tracked for a CMP. We need to confirm that his LFT's did in fact return to normal levels, as his last test was still mildly elevated.   Lowell Makara

## 2011-06-30 NOTE — Telephone Encounter (Signed)
Jonathon Buchanan, is this ok?

## 2011-06-30 NOTE — Progress Notes (Signed)
21 yo man who is here for a recheck on his mono.  He has been OOW since 06/02/11.  His labs were improving as of last check 10 days ago, but had not fully returned to normal.  Now, patient feels fine.  O:  NAD Oroph:  Nl Neck: no adenop, supple Axillae:  Normal Abd:  No HSM, no masses Results for orders placed in visit on 06/30/11  POCT CBC      Component Value Range   WBC 5.3  4.6 - 10.2 (K/uL)   Lymph, poc 2.6  0.6 - 3.4    POC LYMPH PERCENT 49.7  10 - 50 (%L)   MID (cbc) 0.4  0 - 0.9    POC MID % 7.3  0 - 12 (%M)   POC Granulocyte 2.3  2 - 6.9    Granulocyte percent 43.0  37 - 80 (%G)   RBC 4.56 (*) 4.69 - 6.13 (M/uL)   Hemoglobin 13.7 (*) 14.1 - 18.1 (g/dL)   HCT, POC 16.1 (*) 09.6 - 53.7 (%)   MCV 88.3  80 - 97 (fL)   MCH, POC 29.4  27 - 31.2 (pg)   MCHC 33.3  31.8 - 35.4 (g/dL)   RDW, POC 04.5     Platelet Count, POC 240  142 - 424 (K/uL)   MPV 9.3  0 - 99.8 (fL)    A:  Resolved mono  P: RTW tomorrow.

## 2011-07-02 NOTE — Telephone Encounter (Signed)
Called pt and he came in Friday for bloodwork and was released

## 2012-05-27 ENCOUNTER — Encounter (INDEPENDENT_AMBULATORY_CARE_PROVIDER_SITE_OTHER): Payer: 59 | Admitting: General Surgery

## 2013-01-03 IMAGING — CR DG CHEST 2V
3 series · 3 of 3 positions shown · non-contrast
Comparison: CT chest 01/24/2011

CLINICAL DATA: Fever

CHEST - 2 VIEW

[PA]
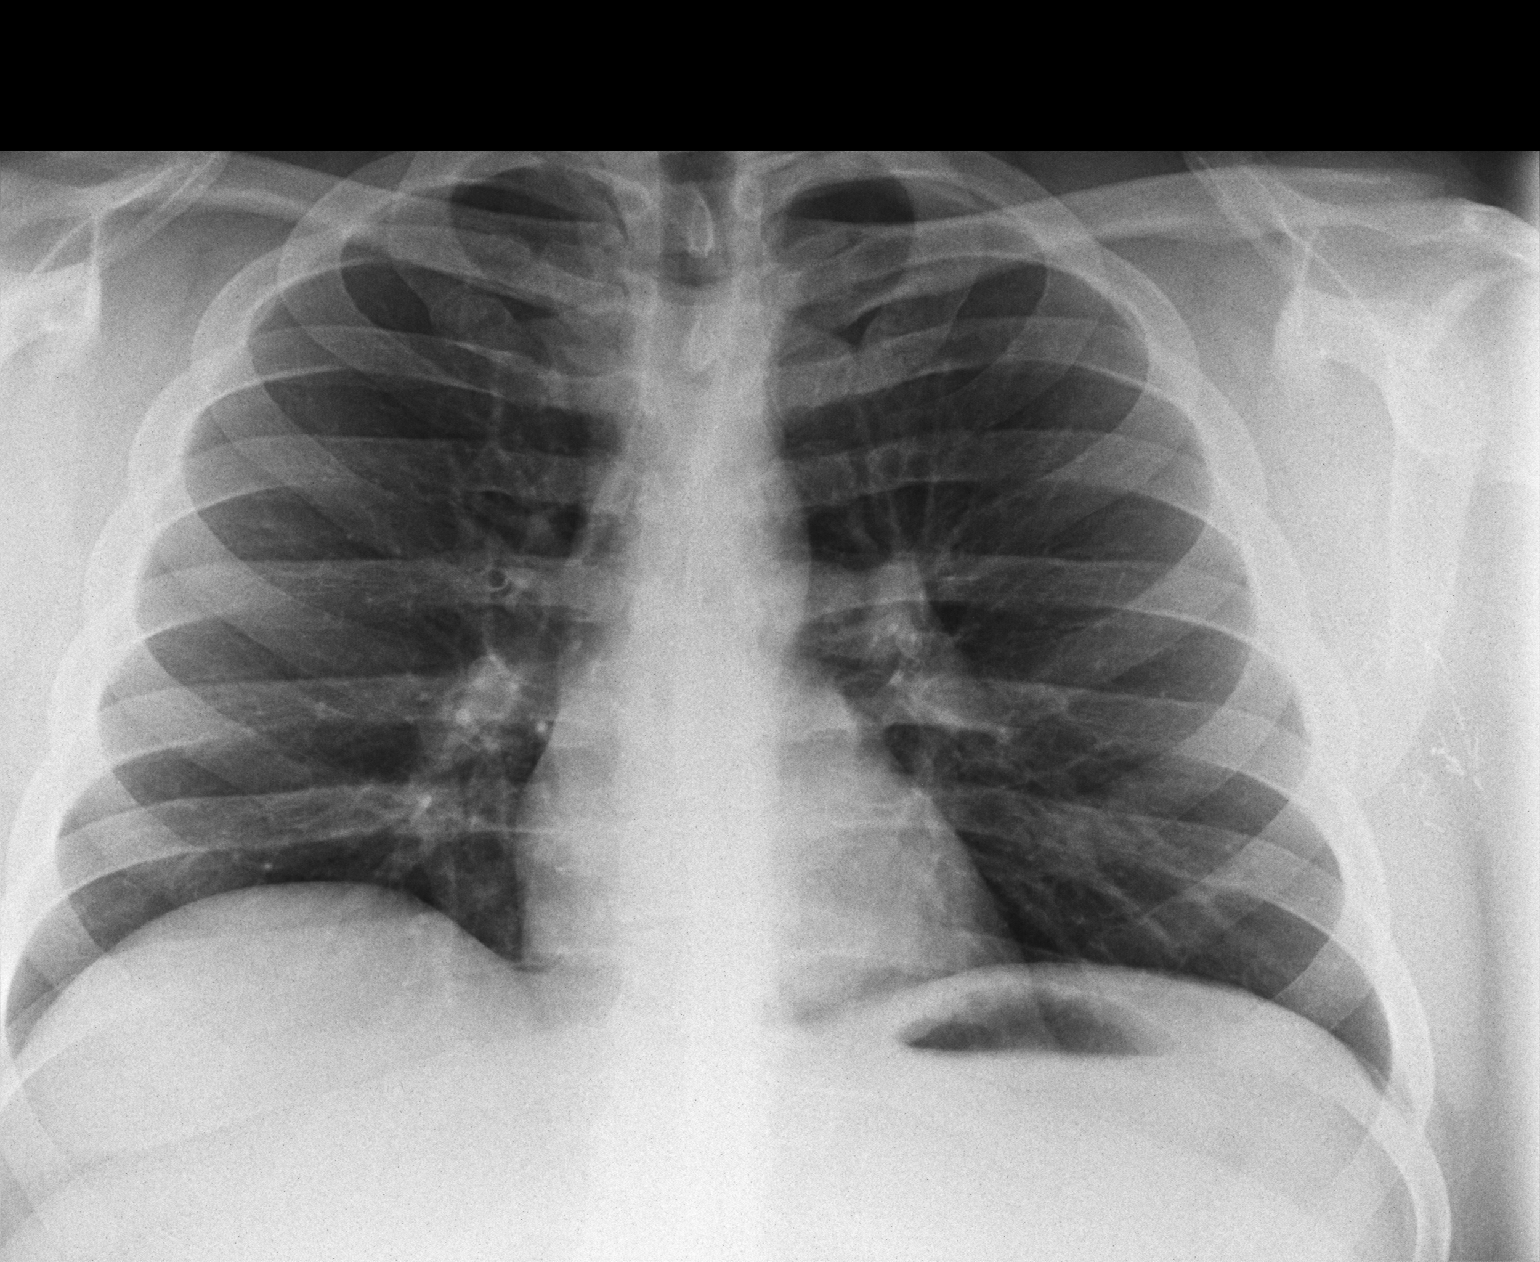

[lateral (1 of 2)]
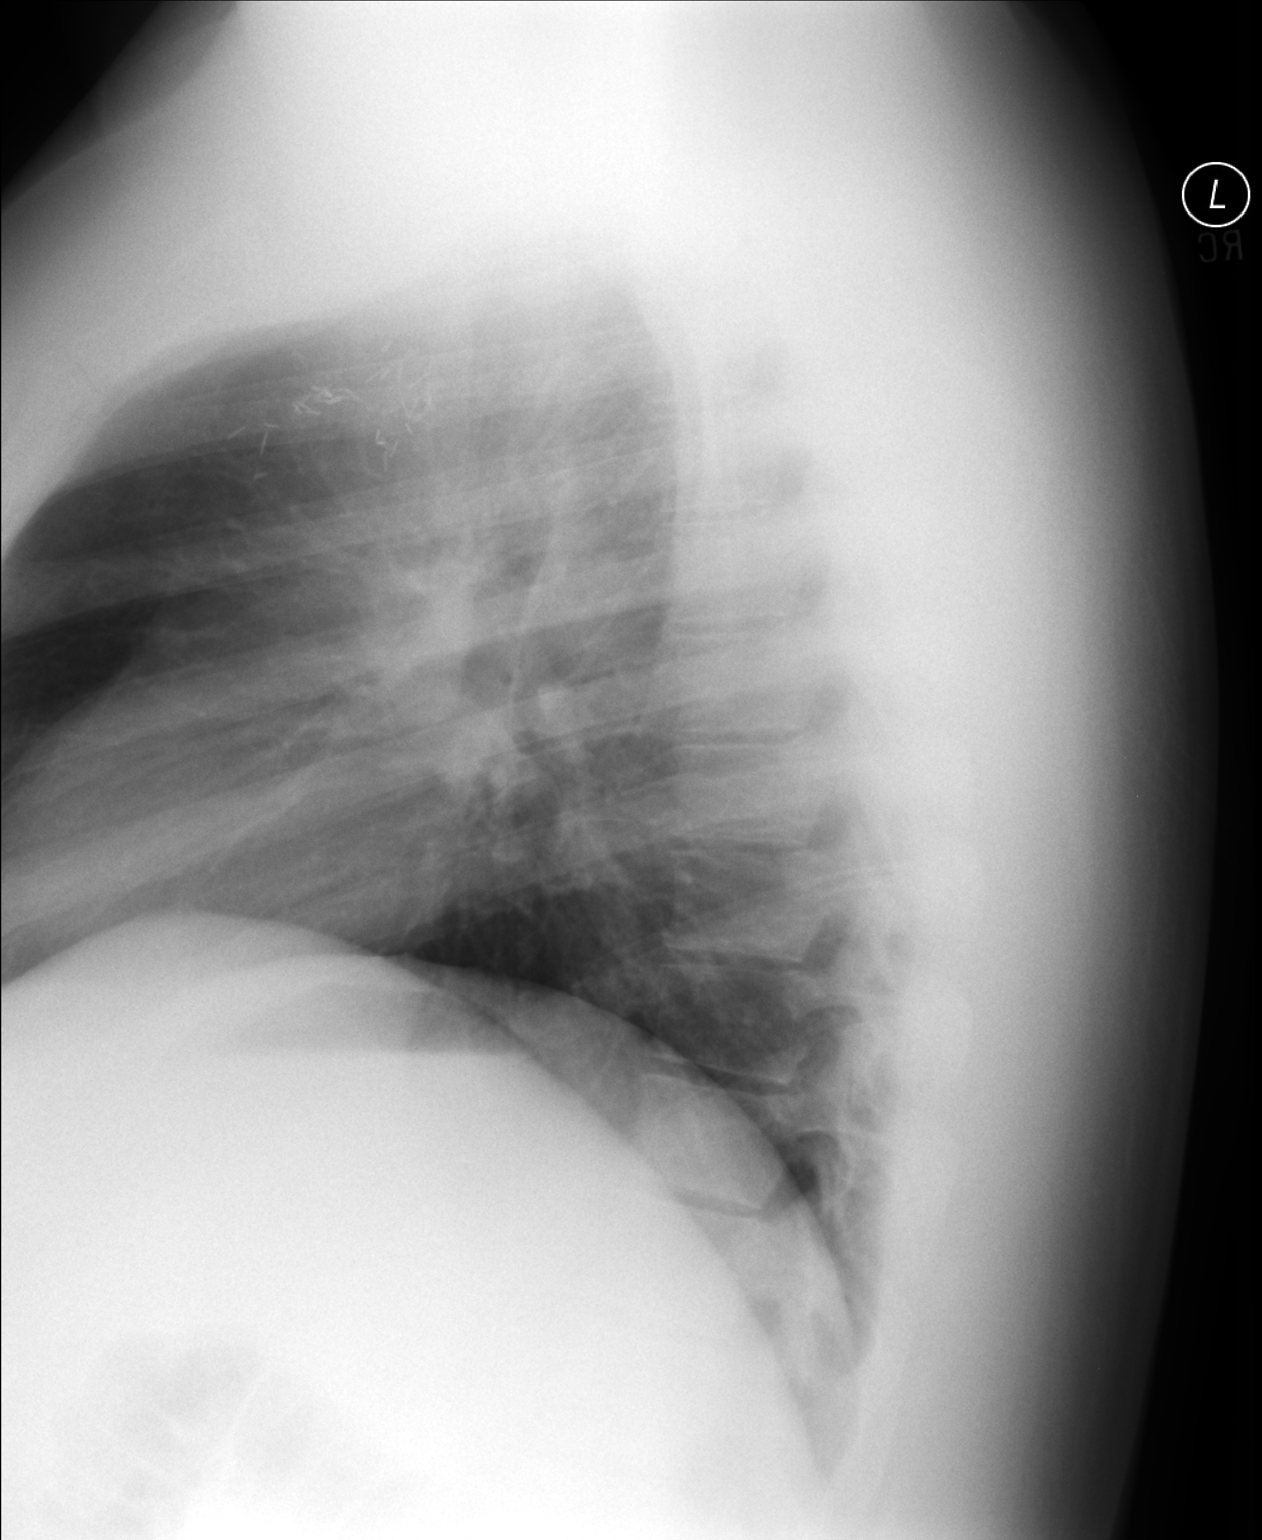

[lateral (2 of 2)]
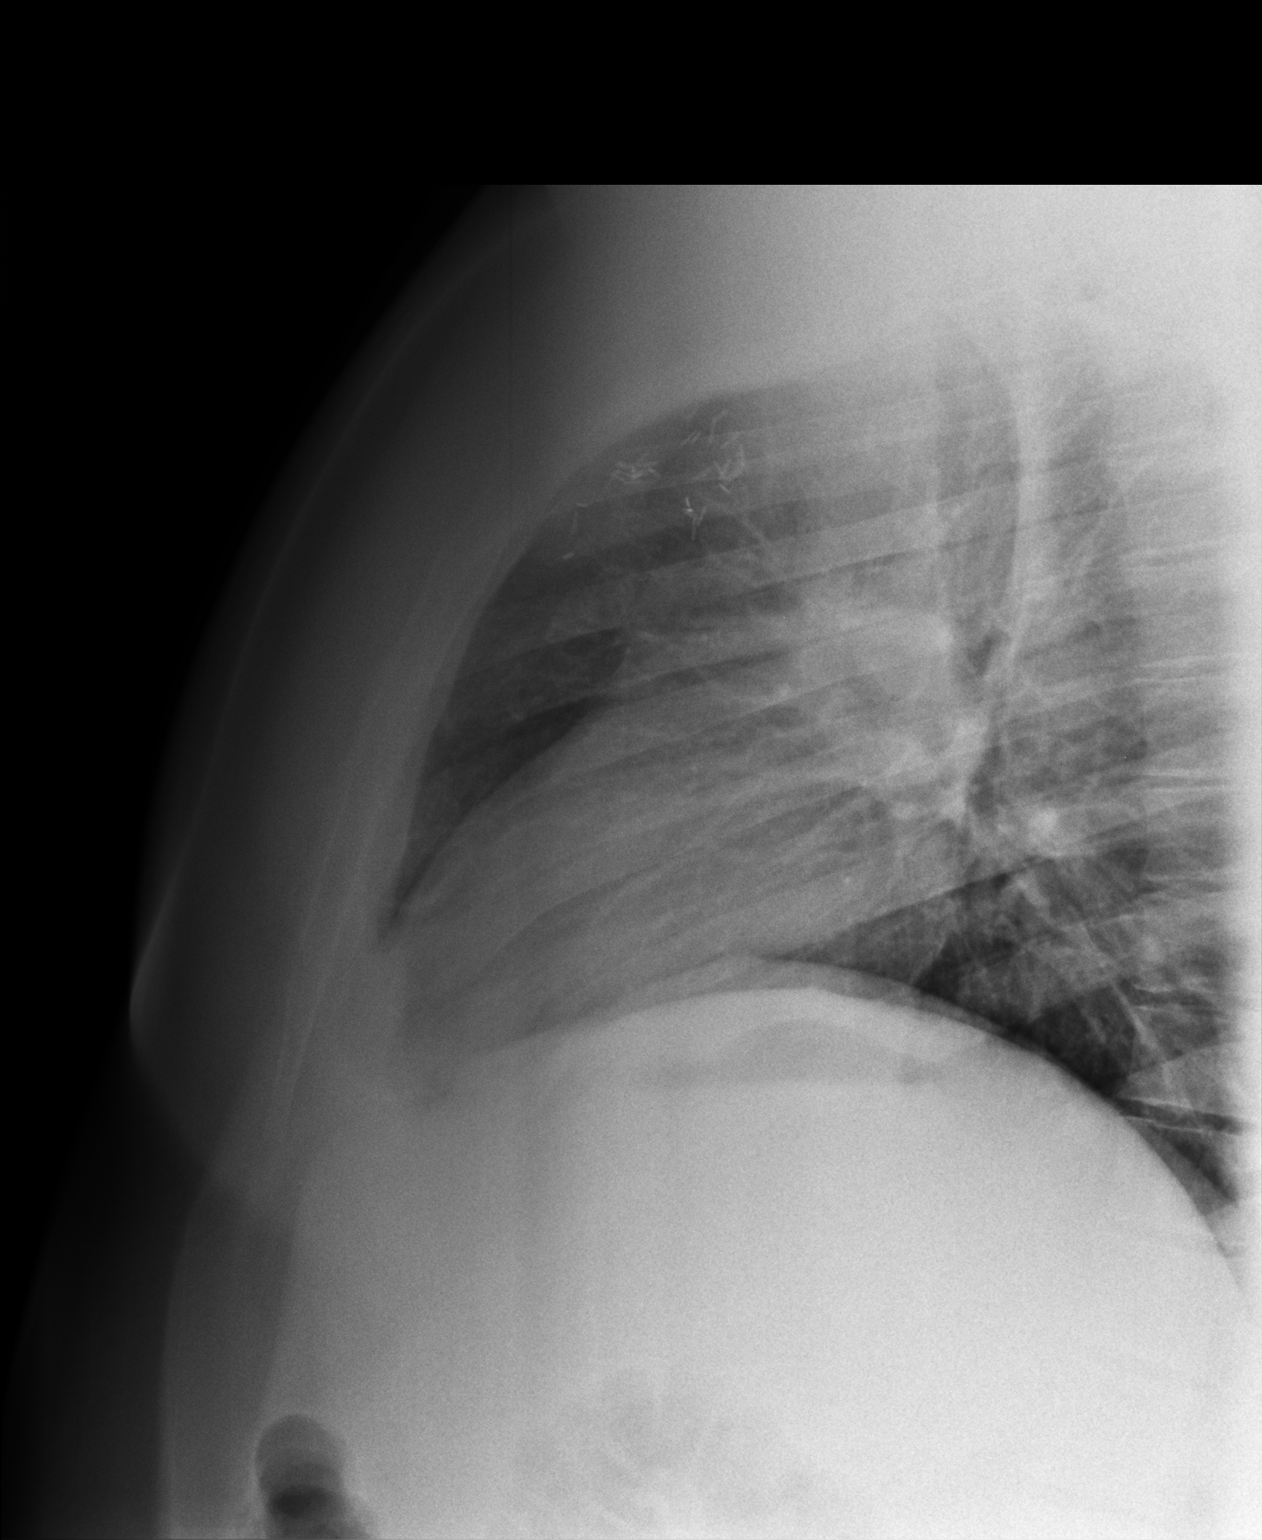

[3 of 3 positions shown; findings below may reference images not displayed]

FINDINGS: Heart size and vascularity are normal.  Lungs are clear.
Negative for infiltrate or effusion.  Negative for mass or
adenopathy.  Surgical clips in the left axilla from prior lymph
node biopsy.
IMPRESSION: No active cardiopulmonary disease.

## 2015-06-14 ENCOUNTER — Encounter: Payer: Self-pay | Admitting: Family Medicine

## 2015-06-14 ENCOUNTER — Ambulatory Visit (INDEPENDENT_AMBULATORY_CARE_PROVIDER_SITE_OTHER): Payer: 59 | Admitting: Family Medicine

## 2015-06-14 VITALS — BP 121/72 | HR 69 | Temp 98.4°F | Ht 75.0 in | Wt 321.2 lb

## 2015-06-14 DIAGNOSIS — E039 Hypothyroidism, unspecified: Secondary | ICD-10-CM | POA: Diagnosis not present

## 2015-06-14 DIAGNOSIS — Z0000 Encounter for general adult medical examination without abnormal findings: Secondary | ICD-10-CM

## 2015-06-14 DIAGNOSIS — M25579 Pain in unspecified ankle and joints of unspecified foot: Secondary | ICD-10-CM

## 2015-06-14 MED ORDER — NAPROXEN 500 MG PO TABS: 500.0000 mg | ORAL_TABLET | Freq: Two times a day (BID) | ORAL | Status: DC

## 2015-06-14 NOTE — Patient Instructions (Signed)
Great to meet you!  Take naprosyn twice daily for 5 days, Try some ice 15 minutes 3 times a day, and wear supportive shoes  Come back with any concerns  If your thyroid is very unusual I will ask you to come back in 2-3 months, otherwise we can see you in 1 year.
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
## 2015-06-15 ENCOUNTER — Other Ambulatory Visit: Payer: Self-pay | Admitting: Family Medicine

## 2015-06-15 LAB — CMP14+EGFR
A/G RATIO: 1.7 (ref 1.1–2.5)
ALBUMIN: 4.4 g/dL (ref 3.5–5.5)
ALT: 34 IU/L (ref 0–44)
AST: 28 IU/L (ref 0–40)
Alkaline Phosphatase: 51 IU/L (ref 39–117)
BILIRUBIN TOTAL: 0.6 mg/dL (ref 0.0–1.2)
BUN / CREAT RATIO: 12 (ref 8–19)
BUN: 13 mg/dL (ref 6–20)
CHLORIDE: 102 mmol/L (ref 96–106)
CO2: 24 mmol/L (ref 18–29)
Calcium: 9.3 mg/dL (ref 8.7–10.2)
Creatinine, Ser: 1.08 mg/dL (ref 0.76–1.27)
GFR calc non Af Amer: 95 mL/min/{1.73_m2} (ref >59)
GFR, EST AFRICAN AMERICAN: 110 mL/min/{1.73_m2} (ref >59)
GLOBULIN, TOTAL: 2.6 g/dL (ref 1.5–4.5)
Glucose: 80 mg/dL (ref 65–99)
POTASSIUM: 4.3 mmol/L (ref 3.5–5.2)
SODIUM: 141 mmol/L (ref 134–144)
TOTAL PROTEIN: 7 g/dL (ref 6.0–8.5)

## 2015-06-15 LAB — CBC
HEMATOCRIT: 40.7 % (ref 37.5–51.0)
Hemoglobin: 14.4 g/dL (ref 12.6–17.7)
MCH: 31 pg (ref 26.6–33.0)
MCHC: 35.4 g/dL (ref 31.5–35.7)
MCV: 88 fL (ref 79–97)
Platelets: 235 10*3/uL (ref 150–379)
RBC: 4.65 x10E6/uL (ref 4.14–5.80)
RDW: 13.3 % (ref 12.3–15.4)
WBC: 10.1 10*3/uL (ref 3.4–10.8)

## 2015-06-15 LAB — LIPID PANEL
Chol/HDL Ratio: 5.1 ratio units — ABNORMAL HIGH (ref 0.0–5.0)
Cholesterol, Total: 157 mg/dL (ref 100–199)
HDL: 31 mg/dL — ABNORMAL LOW (ref >39)
LDL Calculated: 65 mg/dL (ref 0–99)
Triglycerides: 306 mg/dL — ABNORMAL HIGH (ref 0–149)
VLDL Cholesterol Cal: 61 mg/dL — ABNORMAL HIGH (ref 5–40)

## 2015-06-15 LAB — TSH: TSH: 9.16 u[IU]/mL — AB (ref 0.450–4.500)

## 2015-06-15 MED ORDER — LEVOTHYROXINE SODIUM 125 MCG PO TABS: 125.0000 ug | ORAL_TABLET | Freq: Every day | ORAL | Status: DC

## 2015-11-09 ENCOUNTER — Telehealth: Payer: Self-pay | Admitting: Family Medicine

## 2015-11-09 NOTE — Telephone Encounter (Signed)
Appointment given for Thursday with Ronnald Collum, FNP at 10:15.

## 2015-11-11 ENCOUNTER — Ambulatory Visit (INDEPENDENT_AMBULATORY_CARE_PROVIDER_SITE_OTHER): Payer: 59 | Admitting: Nurse Practitioner

## 2015-11-11 ENCOUNTER — Encounter: Payer: Self-pay | Admitting: Nurse Practitioner

## 2015-11-11 VITALS — BP 148/88 | HR 76 | Temp 96.8°F | Ht 75.0 in | Wt 326.0 lb

## 2015-11-11 DIAGNOSIS — S90414A Abrasion, right lesser toe(s), initial encounter: Secondary | ICD-10-CM

## 2015-11-11 DIAGNOSIS — L089 Local infection of the skin and subcutaneous tissue, unspecified: Secondary | ICD-10-CM

## 2015-11-11 MED ORDER — CEPHALEXIN 500 MG PO CAPS: 500.0000 mg | ORAL_CAPSULE | Freq: Three times a day (TID) | ORAL | Status: DC
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
## 2016-03-31 ENCOUNTER — Telehealth: Payer: Self-pay | Admitting: Family Medicine

## 2016-04-03 NOTE — Telephone Encounter (Signed)
Pt came by to pick up Friday 03/31/16

## 2017-09-05 ENCOUNTER — Ambulatory Visit: Payer: 59 | Admitting: Physician Assistant

## 2017-09-05 ENCOUNTER — Encounter: Payer: Self-pay | Admitting: Physician Assistant

## 2017-09-05 ENCOUNTER — Ambulatory Visit (INDEPENDENT_AMBULATORY_CARE_PROVIDER_SITE_OTHER): Payer: 59

## 2017-09-05 VITALS — BP 150/92 | HR 84 | Temp 97.1°F | Ht 75.0 in | Wt 327.0 lb

## 2017-09-05 DIAGNOSIS — M545 Low back pain, unspecified: Secondary | ICD-10-CM

## 2017-09-05 DIAGNOSIS — M5416 Radiculopathy, lumbar region: Secondary | ICD-10-CM | POA: Diagnosis not present

## 2017-09-05 MED ORDER — CYCLOBENZAPRINE HCL 10 MG PO TABS: 10.0000 mg | ORAL_TABLET | Freq: Three times a day (TID) | ORAL | 0 refills | Status: DC | PRN

## 2017-09-05 NOTE — Progress Notes (Signed)
BP (!) 150/92   Pulse 84   Temp (!) 97.1 F (36.2 C) (Oral)   Ht 6\' 3"  (1.905 m)   Wt (!) 327 lb (148.3 kg)   BMI 40.87 kg/m    Subjective:    Patient ID: Jonathon Buchanan, male    DOB: 1991-01-18, 27 y.o.   MRN: [id number]  HPI: Jonathon Buchanan is a 27 y.o. male presenting on 09/05/2017 for Back Pain (Seeing chiropractor since January. He said that he had a buldging disc)  Patient comes in because of chronic low back pain.  That radiates down the left leg.  He has gone through chiropractic treatment and it has had some benefit.  He notes that he has not had any specific injury but does have a lot of lifting on his job.  He states that after finishing chiropractic treatment it always comes back.  He has tried ibuprofen and had no final relief.  One the pain is primarily to the left and does have a radicular pattern.  Past Medical History:  Diagnosis Date  . 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 GERD (gastroesophageal reflux disease)   . High triglycerides   . Hypothyroidism    Relevant past medical, surgical, family and social history reviewed and updated as indicated. Interim medical history since our last visit reviewed. Allergies and medications reviewed and updated. DATA REVIEWED: CHART IN EPIC  Family History reviewed for pertinent findings.  Review of Systems  Constitutional: Negative.  Negative for appetite change and fatigue.  HENT: Negative.   Eyes: Negative.  Negative for pain and visual disturbance.  Respiratory: Negative.  Negative for cough, chest tightness, shortness of breath and wheezing.   Cardiovascular: Negative.  Negative for chest pain, palpitations and leg swelling.  Gastrointestinal: Negative.  Negative for abdominal pain, diarrhea, nausea and vomiting.  Endocrine: Negative.   Genitourinary: Negative.   Musculoskeletal: Negative.   Skin: Negative.  Negative for color change and rash.  Neurological: Negative.  Negative for weakness,  numbness and headaches.  Psychiatric/Behavioral: Negative.     Allergies as of 09/05/2017      Reactions   Sulfa Antibiotics Rash      Medication List        Accurate as of 09/05/17  3:56 PM. Always use your most recent med list.          cyclobenzaprine 10 MG tablet Commonly known as:  FLEXERIL Take 1 tablet (10 mg total) by mouth 3 (three) times daily as needed for muscle spasms.   multivitamins ther. w/minerals Tabs tablet Take 1 tablet by mouth daily.          Objective:    BP (!) 150/92   Pulse 84   Temp (!) 97.1 F (36.2 C) (Oral)   Ht 6\' 3"  (1.905 m)   Wt (!) 327 lb (148.3 kg)   BMI 40.87 kg/m   Allergies  Allergen Reactions  . Sulfa Antibiotics Rash    Wt Readings from Last 3 Encounters:  09/05/17 (!) 327 lb (148.3 kg)  11/11/15 (!) 326 lb (147.9 kg)  06/14/15 (!) 321 lb 3.2 oz (145.7 kg)    Physical Exam  Constitutional: He appears well-developed and well-nourished. No distress.  HENT:  Head: Normocephalic and atraumatic.  Eyes: Pupils are equal, round, and reactive to light. Conjunctivae and EOM are normal.  Cardiovascular: Normal rate, regular rhythm and normal heart sounds.  Pulmonary/Chest: Effort normal and breath sounds normal. No  respiratory distress.  Musculoskeletal:       Lumbar back: He exhibits decreased range of motion, pain and spasm.       Back:  Skin: Skin is warm and dry.  Psychiatric: He has a normal mood and affect. His behavior is normal.  Nursing note and vitals reviewed.       Assessment & Plan:   1. Lumbar back pain - DG Lumbar Spine 2-3 Views; Future  2. Lumbar radiculopathy - DG Lumbar Spine 2-3 Views; Future  surv Continue all other maintenance medications as listed above.  Follow up plan: No follow-ups on file.  Educational handout given for [place] PA-C [place] [address]  [place], [place] [address] [phone]   09/05/2017, 3:56 PM

## 2017-09-05 NOTE — Patient Instructions (Signed)

## 2017-09-10 ENCOUNTER — Other Ambulatory Visit: Payer: Self-pay | Admitting: *Deleted

## 2017-09-10 DIAGNOSIS — M545 Low back pain, unspecified: Secondary | ICD-10-CM

## 2017-09-11 ENCOUNTER — Telehealth: Payer: Self-pay | Admitting: Physician Assistant

## 2017-09-11 NOTE — Telephone Encounter (Signed)
Pt aware of MRI date /time

## 2017-09-11 NOTE — Telephone Encounter (Signed)
This was already reported:  Notes recorded by Thana Ates, LPN on 3/43/7357 at 89:78 AM EDT Patients wife aware of results and MRI order placed ------  Notes recorded by Terald Sleeper, PA-C on 09/10/2017 at 10:55 AM EDT Plan lumbar MRI ------  Notes recorded by Thana Ates, LPN on 4/78/4128 at 2:08 AM EDT What do you recommend for patient to do now. 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

## 2017-09-13 ENCOUNTER — Ambulatory Visit (HOSPITAL_COMMUNITY)
Admission: RE | Admit: 2017-09-13 | Discharge: 2017-09-13 | Disposition: A | Discharge status: Home / Self Care | Payer: 59 | Source: Ambulatory Visit | Attending: Physician Assistant | Admitting: Physician Assistant

## 2017-09-13 DIAGNOSIS — M545 Low back pain, unspecified: Secondary | ICD-10-CM

## 2017-09-13 DIAGNOSIS — M5124 Other intervertebral disc displacement, thoracic region: Secondary | ICD-10-CM | POA: Insufficient documentation

## 2017-09-13 DIAGNOSIS — M47896 Other spondylosis, lumbar region: Secondary | ICD-10-CM | POA: Insufficient documentation

## 2017-09-14 ENCOUNTER — Other Ambulatory Visit: Payer: Self-pay | Admitting: *Deleted

## 2017-09-14 ENCOUNTER — Ambulatory Visit (HOSPITAL_COMMUNITY)
Admission: RE | Admit: 2017-09-14 | Discharge: 2017-09-14 | Disposition: A | Discharge status: Home / Self Care | Payer: 59 | Source: Ambulatory Visit | Attending: Physician Assistant | Admitting: Physician Assistant

## 2017-09-14 DIAGNOSIS — G9520 Unspecified cord compression: Secondary | ICD-10-CM

## 2017-09-14 DIAGNOSIS — M5124 Other intervertebral disc displacement, thoracic region: Secondary | ICD-10-CM | POA: Insufficient documentation

## 2017-09-14 DIAGNOSIS — M4804 Spinal stenosis, thoracic region: Secondary | ICD-10-CM | POA: Insufficient documentation

## 2017-09-14 DIAGNOSIS — M4300 Spondylolysis, site unspecified: Secondary | ICD-10-CM

## 2017-09-19 ENCOUNTER — Telehealth: Payer: Self-pay | Admitting: Physician Assistant

## 2017-09-20 NOTE — Telephone Encounter (Signed)
Pt aware of MRI results 

## 2017-10-04 DIAGNOSIS — M5116 Intervertebral disc disorders with radiculopathy, lumbar region: Secondary | ICD-10-CM | POA: Insufficient documentation

## 2017-10-11 ENCOUNTER — Ambulatory Visit: Payer: 59 | Admitting: Family Medicine

## 2017-10-11 ENCOUNTER — Encounter: Payer: Self-pay | Admitting: Family Medicine

## 2017-10-11 ENCOUNTER — Ambulatory Visit (INDEPENDENT_AMBULATORY_CARE_PROVIDER_SITE_OTHER): Payer: 59

## 2017-10-11 VITALS — BP 162/86 | HR 92 | Temp 98.3°F | Ht 75.0 in | Wt 324.2 lb

## 2017-10-11 DIAGNOSIS — M25471 Effusion, right ankle: Secondary | ICD-10-CM

## 2017-10-11 DIAGNOSIS — S93491A Sprain of other ligament of right ankle, initial encounter: Secondary | ICD-10-CM | POA: Diagnosis not present

## 2017-10-11 NOTE — Progress Notes (Signed)
BP (!) 162/86   Pulse 92   Temp 98.3 F (36.8 C) (Oral)   Ht 6\' 3"  (1.905 m)   Wt (!) 324 lb 3.2 oz (147.1 kg)   BMI 40.52 kg/m    Subjective:    Patient ID: Jonathon Buchanan, male    DOB: [birth year], 27 y.o.   MRN: [id number]  HPI Patient is a 27yr old male who presents to the clinic today with 2-day history of right foot pain and swelling. He noticed that his foot was hurting on Tuesday morning and since then, has noticed increased discomfort and swelling. He has taken tylenol, applied ice and taken Epsom salt baths, which helped alleviate the pain a little. The pain is worse with weight bearing and walking.  He has a history of herniated discs, which have caused him to lose some sensation in bilateral lower extremities. He has just recently begun to regain sensation in his right calf and right foot. He does not recall injuring or rolling his ankle, but is not entirely sure due to decreased sensation.  Relevant past medical, surgical, family and social history reviewed and updated as indicated. Interim medical history since our last visit reviewed. Allergies and medications reviewed and updated.  Review of Systems  Cardiovascular: Negative for chest pain.  Musculoskeletal: Positive for back pain and joint swelling.       Patient reports pain and swelling in right foot and ankle, which is painful with weight-bearing and walking. He has a significant history of chronic lower back pain and several herniated discs.  Skin: Negative for color change.  Neurological: Positive for numbness. Negative for dizziness and headaches.       Patient reports some loss of sensation in bilateral lower extremity which has just recently begun to improve.    Per HPI unless specifically indicated above  Allergies as of 10/11/2017      Reactions   Sulfa Antibiotics Rash      Medication List        Accurate as of 10/11/17  5:09 PM. Always use your most recent med list.          cyclobenzaprine 10  MG tablet Commonly known as:  FLEXERIL Take 1 tablet (10 mg total) by mouth 3 (three) times daily as needed for muscle spasms.   multivitamins ther. w/minerals Tabs tablet Take 1 tablet by mouth daily.          Objective:    BP (!) 162/86   Pulse 92   Temp 98.3 F (36.8 C) (Oral)   Ht 6\' 3"  (1.905 m)   Wt (!) 324 lb 3.2 oz (147.1 kg)   BMI 40.52 kg/m   Wt Readings from Last 3 Encounters:  10/11/17 (!) 324 lb 3.2 oz (147.1 kg)  09/05/17 (!) 327 lb (148.3 kg)  11/11/15 (!) 326 lb (147.9 kg)    Physical Exam  Constitutional: He is oriented to person, place, and time. He appears well-developed and well-nourished. No distress.  Cardiovascular: Normal rate, regular rhythm, normal heart sounds and intact distal pulses.  Pulmonary/Chest: Effort normal and breath sounds normal.  Musculoskeletal:       Right ankle: He exhibits swelling. He exhibits no ecchymosis and normal pulse. Tenderness.       Feet:  Patient has TTP along the top of his foot at the area indicated in red. Pain with dorsiflexion, plantar flexion and left to right rotation. There is no bruising or wound noted on the foot.   Neurological:  He is alert and oriented to person, place, and time.  Skin: Skin is warm and dry.  Psychiatric: He has a normal mood and affect.      Assessment & Plan:  The patient presents to clinic with 2-day history of worsening right ankle & foot pain and swelling. An XR was performed in office which reveals no evidence of fracture or acute bony abnormalities. Although he cannot recall injuring his foot, his history, presentation and diminished sensation are indicative of a right ankle sprain.  I have advised the patient to continue icing his ankle and apply a compression wrap to help with swelling. He is currently scheduled for lumbar injections next week and has been advised to forgo anti-inflammatories at this time. He may take Tylenol prn for pain. I have advised him to begin with gentle  stretches as tolerated and to begin weight bearing exercises once he has regained full sensation.   Problem List Items Addressed This Visit    None    Visit Diagnoses    Sprain of other ligament of right ankle, initial encounter    -  Primary   Relevant Orders   DG Ankle Complete Right (Completed)       Follow up plan: I have advised the patient to contact our office should his symptoms worsen or not improve.   Darla Lesches, PA-S  Counseling provided for all of the vaccine components No orders of the defined types were placed in this encounter.  Patient seen and examined with Vita Barley, PA student, agree with assessment and plan above, likely a sprain, due to patient's decreased sensation in that foot, he may have sprained it without knowing. Caryl Pina, MD [facility] 10/11/2017, 5:09 PM

## 2017-10-13 ENCOUNTER — Other Ambulatory Visit: Payer: Self-pay | Admitting: Physician Assistant

## 2017-10-13 MED ORDER — ICOSAPENT ETHYL 1 G PO CAPS: 2.0000 g | ORAL_CAPSULE | Freq: Two times a day (BID) | ORAL | 3 refills | Status: DC

## 2017-10-19 ENCOUNTER — Ambulatory Visit: Payer: 59 | Admitting: Physician Assistant

## 2017-10-19 ENCOUNTER — Encounter: Payer: Self-pay | Admitting: Physician Assistant

## 2017-10-19 VITALS — BP 145/82 | HR 93 | Temp 97.0°F | Ht 75.0 in

## 2017-10-19 DIAGNOSIS — G9520 Unspecified cord compression: Secondary | ICD-10-CM | POA: Diagnosis not present

## 2017-10-19 DIAGNOSIS — M109 Gout, unspecified: Secondary | ICD-10-CM

## 2017-10-19 DIAGNOSIS — M5126 Other intervertebral disc displacement, lumbar region: Secondary | ICD-10-CM

## 2017-10-19 MED ORDER — GENERIC EXTERNAL MEDICATION
1.50 g | Status: DC
Start: 2017-10-17 — End: 2017-10-19

## 2017-10-19 MED ORDER — VITAMIN C 500 MG PO TABS
500.00 | ORAL_TABLET | ORAL | Status: DC
Start: 2017-10-17 — End: 2017-10-19

## 2017-10-19 MED ORDER — COLCHICINE 0.6 MG PO CAPS
.60 | ORAL_CAPSULE | ORAL | Status: DC
Start: 2017-10-17 — End: 2017-10-19

## 2017-10-19 MED ORDER — CHOLECALCIFEROL 25 MCG (1000 UT) PO TABS
1000.00 | ORAL_TABLET | ORAL | Status: DC
Start: 2017-10-18 — End: 2017-10-19

## 2017-10-19 MED ORDER — HYDROCODONE-ACETAMINOPHEN 5-325 MG PO TABS
1.00 | ORAL_TABLET | ORAL | Status: DC
Start: ? — End: 2017-10-19

## 2017-10-19 MED ORDER — CALCIUM CITRATE 200 MG PO TABS
950.00 | ORAL_TABLET | ORAL | Status: DC
Start: 2017-10-18 — End: 2017-10-19

## 2017-10-19 MED ORDER — BISACODYL 10 MG RE SUPP
10.00 | RECTAL | Status: DC
Start: ? — End: 2017-10-19

## 2017-10-19 MED ORDER — ASPIRIN EC 81 MG PO TBEC
81.00 | DELAYED_RELEASE_TABLET | ORAL | Status: DC
Start: 2017-10-17 — End: 2017-10-19

## 2017-10-19 MED ORDER — ACETAMINOPHEN 325 MG PO TABS
325.00 | ORAL_TABLET | ORAL | Status: DC
Start: 2017-10-17 — End: 2017-10-19

## 2017-10-19 MED ORDER — GENERIC EXTERNAL MEDICATION
1.00 | Status: DC
Start: ? — End: 2017-10-19

## 2017-10-19 MED ORDER — POLYSACCHARIDE IRON COMPLEX 150 MG PO CAPS
150.00 | ORAL_CAPSULE | ORAL | Status: DC
Start: 2017-10-18 — End: 2017-10-19

## 2017-10-19 MED ORDER — SENNOSIDES-DOCUSATE SODIUM 8.6-50 MG PO TABS
2.00 | ORAL_TABLET | ORAL | Status: DC
Start: 2017-10-17 — End: 2017-10-19

## 2017-10-19 MED ORDER — ALUMINUM-MAGNESIUM-SIMETHICONE 200-200-20 MG/5ML PO SUSP
30.00 | ORAL | Status: DC
Start: ? — End: 2017-10-19

## 2017-10-19 NOTE — Progress Notes (Signed)
BP (!) 145/82   Pulse 93   Temp (!) 97 F (36.1 C) (Oral)   Ht 6\' 3"  (1.905 m)   BMI 40.52 kg/m    Subjective:    Patient ID: Jonathon Buchanan, male    DOB: Aug 31, 1990, 27 y.o.   MRN: [id number]  HPI: EDRIS FRIEDT is a 27 y.o. male presenting on 10/19/2017 for Hospitalization Follow-up [facility] for foot infection )  Comes on for post hospital admission visit.  He had a large right ankle, thought to be gout, then thought to be infection. Surgical I & D was performed by Clyde Canterbury. Cultures have some back negative at this time. So the final decision is that it was severe gout.    The patient had gone on a keto diet in order to lose weight because of a ruptured disc problem that he has.  He also has elevated triglycerides.  So he was trying to reduce his carbs and sugar.  He will not go on a diet like this again.  We have discussed some moderate options for him.  We will wait for any other recurrences of gout in order to place him on attention in the future.  He does have follow-up with orthopedics at [facility] and he does have a follow-up with his neurosurgeon.  Past Medical History:  Diagnosis Date  . 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 GERD (gastroesophageal reflux disease)   . High triglycerides   . Hypothyroidism    Relevant past medical, surgical, family and social history reviewed and updated as indicated. Interim medical history since our last visit reviewed. Allergies and medications reviewed and updated. DATA REVIEWED: CHART IN EPIC  Family History reviewed for pertinent findings.  Review of Systems  Constitutional: Negative.  Negative for appetite change and fatigue.  HENT: Negative.   Eyes: Negative.  Negative for pain and visual disturbance.  Respiratory: Negative.  Negative for cough, chest tightness, shortness of breath and wheezing.   Cardiovascular: Negative.  Negative for chest pain, palpitations and leg swelling.  Gastrointestinal:  Negative.  Negative for abdominal pain, diarrhea, nausea and vomiting.  Endocrine: Negative.   Genitourinary: Negative.   Musculoskeletal: Positive for arthralgias, back pain and joint swelling.  Skin: Negative.  Negative for color change and rash.  Neurological: Negative.  Negative for weakness, numbness and headaches.  Psychiatric/Behavioral: Negative.     Allergies as of 10/19/2017      Reactions   Sulfa Antibiotics Rash      Medication List        Accurate as of 10/19/17  2:01 PM. Always use your most recent med list.          Colchicine 0.6 MG Caps TAKE 1 CAPSULE BY MOUTH TWICE DAILY FOR 4 DAYS   cyclobenzaprine 10 MG tablet Commonly known as:  FLEXERIL Take 1 tablet (10 mg total) by mouth 3 (three) times daily as needed for muscle spasms.   EQ ASPIRIN ADULT LOW DOSE 81 MG EC tablet Generic drug:  aspirin TAKE 1 TABLET BY MOUTH TWICE DAILY (LAST DOSE 8 5 19)   Icosapent Ethyl 1 g Caps Commonly known as:  VASCEPA Take 2 capsules (2 g total) by mouth 2 (two) times daily.   multivitamins ther. w/minerals Tabs tablet Take 1 tablet by mouth daily.          Objective:    BP (!) 145/82   Pulse 93   Temp (!) 97 F (36.1 C) (  Oral)   Ht 6\' 3"  (1.905 m)   BMI 40.52 kg/m   Allergies  Allergen Reactions  . Sulfa Antibiotics Rash    Wt Readings from Last 3 Encounters:  10/11/17 (!) 324 lb 3.2 oz (147.1 kg)  09/05/17 (!) 327 lb (148.3 kg)  11/11/15 (!) 326 lb (147.9 kg)    Physical Exam  Constitutional: He appears well-developed and well-nourished. No distress.  HENT:  Head: Normocephalic and atraumatic.  Eyes: Pupils are equal, round, and reactive to light. Conjunctivae and EOM are normal.  Cardiovascular: Normal rate, regular rhythm and normal heart sounds.  Pulmonary/Chest: Effort normal and breath sounds normal. No respiratory distress.  Skin: Skin is warm and dry.  Psychiatric: He has a normal mood and affect. His behavior is normal.  Nursing note and  vitals reviewed.       Assessment & Plan:   1. Acute gout of right ankle, unspecified cause - Colchicine 0.6 MG CAPS; TAKE 1 CAPSULE BY MOUTH TWICE DAILY FOR 4 DAYS; Refill: 0 - EQ ASPIRIN ADULT LOW DOSE 81 MG EC tablet; TAKE 1 TABLET BY MOUTH TWICE DAILY (LAST DOSE 8 5 19); Refill: 0   Continue all other maintenance medications as listed above.  Follow up plan: No follow-ups on file.  Educational handout given for [place] PA-C [place] [address]  [place], [place] [address] [phone]   10/19/2017, 2:01 PM

## 2017-10-30 ENCOUNTER — Other Ambulatory Visit: Payer: 59

## 2017-10-30 ENCOUNTER — Other Ambulatory Visit: Payer: Self-pay | Admitting: *Deleted

## 2017-10-30 DIAGNOSIS — M109 Gout, unspecified: Secondary | ICD-10-CM

## 2017-10-30 LAB — CBC WITH DIFFERENTIAL/PLATELET
BASOS: 0 %
Basophils Absolute: 0 10*3/uL (ref 0.0–0.2)
EOS (ABSOLUTE): 0.1 10*3/uL (ref 0.0–0.4)
EOS: 1 %
HEMOGLOBIN: 14 g/dL (ref 13.0–17.7)
Hematocrit: 39.8 % (ref 37.5–51.0)
IMMATURE GRANS (ABS): 0 10*3/uL (ref 0.0–0.1)
IMMATURE GRANULOCYTES: 0 %
LYMPHS: 27 %
Lymphocytes Absolute: 2.2 10*3/uL (ref 0.7–3.1)
MCH: 30.4 pg (ref 26.6–33.0)
MCHC: 35.2 g/dL (ref 31.5–35.7)
MCV: 86 fL (ref 79–97)
MONOCYTES: 13 %
Monocytes Absolute: 1 10*3/uL — ABNORMAL HIGH (ref 0.1–0.9)
NEUTROS ABS: 4.8 10*3/uL (ref 1.4–7.0)
Neutrophils: 59 %
PLATELETS: 288 10*3/uL (ref 150–450)
RBC: 4.61 x10E6/uL (ref 4.14–5.80)
RDW: 13 % (ref 12.3–15.4)
WBC: 8.1 10*3/uL (ref 3.4–10.8)

## 2017-10-30 LAB — URIC ACID: Uric Acid: 8.6 mg/dL (ref 3.7–8.6)

## 2017-10-31 ENCOUNTER — Ambulatory Visit: Payer: 59 | Admitting: Physician Assistant

## 2017-10-31 ENCOUNTER — Encounter: Payer: Self-pay | Admitting: Physician Assistant

## 2017-10-31 VITALS — BP 146/82 | HR 86 | Temp 99.9°F | Ht 75.0 in | Wt 320.6 lb

## 2017-10-31 DIAGNOSIS — M109 Gout, unspecified: Secondary | ICD-10-CM

## 2017-10-31 DIAGNOSIS — R509 Fever, unspecified: Secondary | ICD-10-CM

## 2017-10-31 DIAGNOSIS — R030 Elevated blood-pressure reading, without diagnosis of hypertension: Secondary | ICD-10-CM | POA: Diagnosis not present

## 2017-10-31 MED ORDER — LISINOPRIL 10 MG PO TABS: 10.0000 mg | ORAL_TABLET | Freq: Every day | ORAL | 3 refills | Status: AC

## 2017-10-31 MED ORDER — COLCHICINE 0.6 MG PO CAPS: ORAL_CAPSULE | ORAL | 2 refills | Status: DC

## 2017-10-31 MED ORDER — ALLOPURINOL 300 MG PO TABS: 300.0000 mg | ORAL_TABLET | Freq: Every day | ORAL | 1 refills | Status: DC

## 2017-11-02 DIAGNOSIS — R030 Elevated blood-pressure reading, without diagnosis of hypertension: Secondary | ICD-10-CM | POA: Insufficient documentation

## 2017-11-02 NOTE — Progress Notes (Signed)
BP (!) 146/82   Pulse 86   Temp 99.9 F (37.7 C) (Oral)   Ht 6\' 3"  (1.905 m)   Wt (!) 320 lb 9.6 oz (145.4 kg)   BMI 40.07 kg/m    Subjective:    Patient ID: Jonathon Buchanan, male    DOB: Nov 08, 1990, 27 y.o.   MRN: [id number]  HPI: Jonathon Buchanan is a 27 y.o. male presenting on 10/31/2017 for Fever (off and on ) and Foot Pain He has had 2 days of mild fever and increased pain in the right ankle. He goes back to ortho this week for a recheck.  He will likely be out of work longer because he is unable to perform his active duties. No other signs and symptoms of respiratory, gastroenterology and urinary illness.  Past Medical History:  Diagnosis Date  . 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 GERD (gastroesophageal reflux disease)   . High triglycerides   . Hypothyroidism    Relevant past medical, surgical, family and social history reviewed and updated as indicated. Interim medical history since our last visit reviewed. Allergies and medications reviewed and updated. DATA REVIEWED: CHART IN EPIC  Family History reviewed for pertinent findings.  Review of Systems  Constitutional: Negative.  Negative for appetite change and fatigue.  HENT: Negative.   Eyes: Negative.  Negative for pain and visual disturbance.  Respiratory: Negative.  Negative for cough, chest tightness, shortness of breath and wheezing.   Cardiovascular: Negative.  Negative for chest pain, palpitations and leg swelling.  Gastrointestinal: Negative.  Negative for abdominal pain, diarrhea, nausea and vomiting.  Endocrine: Negative.   Genitourinary: Negative.   Musculoskeletal: Positive for arthralgias and joint swelling.  Skin: Positive for color change. Negative for rash.  Neurological: Negative.  Negative for weakness, numbness and headaches.  Psychiatric/Behavioral: Negative.     Allergies as of 10/31/2017      Reactions   Sulfa Antibiotics Rash      Medication List        Accurate as  of 10/31/17 11:59 PM. Always use your most recent med list.          allopurinol 300 MG tablet Commonly known as:  ZYLOPRIM Take 1 tablet (300 mg total) by mouth daily.   Colchicine 0.6 MG Caps TAKE 1 CAPSULE BY MOUTH TWICE DAILY FOR 4 DAYS   cyclobenzaprine 10 MG tablet Commonly known as:  FLEXERIL Take 1 tablet (10 mg total) by mouth 3 (three) times daily as needed for muscle spasms.   EQ ASPIRIN ADULT LOW DOSE 81 MG EC tablet Generic drug:  aspirin TAKE 1 TABLET BY MOUTH TWICE DAILY (LAST DOSE 8 5 19)   Icosapent Ethyl 1 g Caps Commonly known as:  VASCEPA Take 2 capsules (2 g total) by mouth 2 (two) times daily.   lisinopril 10 MG tablet Commonly known as:  PRINIVIL,ZESTRIL Take 1 tablet (10 mg total) by mouth daily.   multivitamins ther. w/minerals Tabs tablet Take 1 tablet by mouth daily.          Objective:    BP (!) 146/82   Pulse 86   Temp 99.9 F (37.7 C) (Oral)   Ht 6\' 3"  (1.905 m)   Wt (!) 320 lb 9.6 oz (145.4 kg)   BMI 40.07 kg/m   Allergies  Allergen Reactions  . Sulfa Antibiotics Rash    Wt Readings from Last 3 Encounters:  10/31/17 (!) 320 lb 9.6 oz (145.4 kg)  10/11/17 (!) 324 lb 3.2 oz (147.1 kg)  09/05/17 (!) 327 lb (148.3 kg)    Physical Exam  Constitutional: He appears well-developed and well-nourished. No distress.  HENT:  Head: Normocephalic and atraumatic.  Eyes: Pupils are equal, round, and reactive to light. Conjunctivae and EOM are normal.  Cardiovascular: Normal rate, regular rhythm and normal heart sounds.  Pulmonary/Chest: Effort normal and breath sounds normal. No respiratory distress.  Musculoskeletal:       Right ankle: He exhibits decreased range of motion and swelling. He exhibits no deformity and no laceration. Tenderness.       Feet:  Skin: Skin is warm and dry.  Psychiatric: He has a normal mood and affect. His behavior is normal.  Nursing note and vitals reviewed.   Results for orders placed or performed in  visit on 10/30/17  Uric acid  Result Value Ref Range   Uric Acid 8.6 3.7 - 8.6 mg/dL  CBC with Differential/Platelet  Result Value Ref Range   WBC 8.1 3.4 - 10.8 x10E3/uL   RBC 4.61 4.14 - 5.80 x10E6/uL   Hemoglobin 14.0 13.0 - 17.7 g/dL   Hematocrit 39.8 37.5 - 51.0 %   MCV 86 79 - 97 fL   MCH 30.4 26.6 - 33.0 pg   MCHC 35.2 31.5 - 35.7 g/dL   RDW 13.0 12.3 - 15.4 %   Platelets 288 150 - 450 x10E3/uL   Neutrophils 59 Not Estab. %   Lymphs 27 Not Estab. %   Monocytes 13 Not Estab. %   Eos 1 Not Estab. %   Basos 0 Not Estab. %   Neutrophils Absolute 4.8 1.4 - 7.0 x10E3/uL   Lymphocytes Absolute 2.2 0.7 - 3.1 x10E3/uL   Monocytes Absolute 1.0 (H) 0.1 - 0.9 x10E3/uL   EOS (ABSOLUTE) 0.1 0.0 - 0.4 x10E3/uL   Basophils Absolute 0.0 0.0 - 0.2 x10E3/uL   Immature Granulocytes 0 Not Estab. %   Immature Grans (Abs) 0.0 0.0 - 0.1 x10E3/uL      Assessment & Plan:   1. Acute gout of right ankle, unspecified cause - Colchicine 0.6 MG CAPS; TAKE 1 CAPSULE BY MOUTH TWICE DAILY FOR 4 DAYS  Dispense: 30 capsule; Refill: 2 - allopurinol (ZYLOPRIM) 300 MG tablet; Take 1 tablet (300 mg total) by mouth daily.  Dispense: 30 tablet; Refill: 1  2. Elevated BP without diagnosis of hypertension - lisinopril (PRINIVIL,ZESTRIL) 10 MG tablet; Take 1 tablet (10 mg total) by mouth daily.  Dispense: 90 tablet; Refill: 3  3. Fever, unspecified fever cause Watch and conservative management   Continue all other maintenance medications as listed above.  Follow up plan: Return in about 1 month (around 11/28/2017) for recheck.  Educational handout given for [place] PA-C [place] [address]  [place], [place] [address] [phone]   11/02/2017, 12:26 PM

## 2019-04-12 IMAGING — MR MR LUMBAR SPINE W/O CM
4 of 5 series · 26 of 48 positions shown · non-contrast
Comparison: Plain films 09/05/2017.

CLINICAL DATA: Low back pain.  LEFT leg pain.

EXAM:
MRI LUMBAR SPINE WITHOUT CONTRAST
TECHNIQUE: Multiplanar, multisequence MR imaging of the lumbar spine was
performed. No intravenous contrast was administered.

[Series 9: T2 · sagittal · 4.0mm · 0.73mm/px · 6 of 16 slices shown (1 of 2)]
[im 1/16]
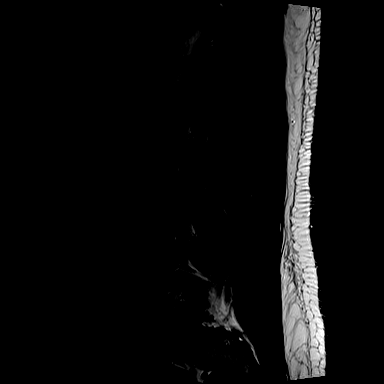
[im 4/16]
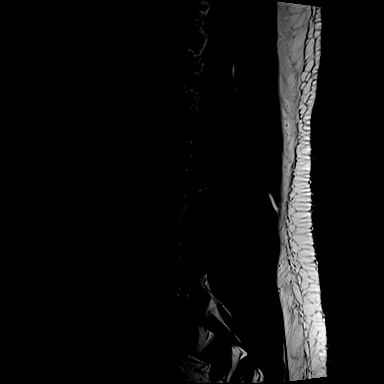
[im 7/16]
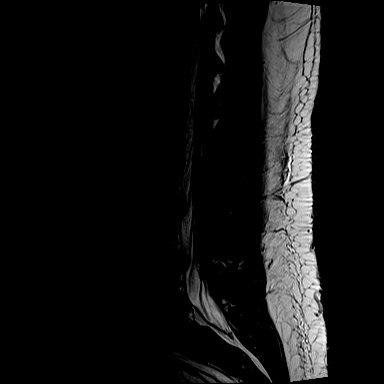
[im 10/16]
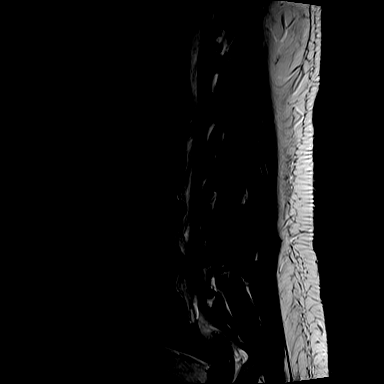
[im 13/16]
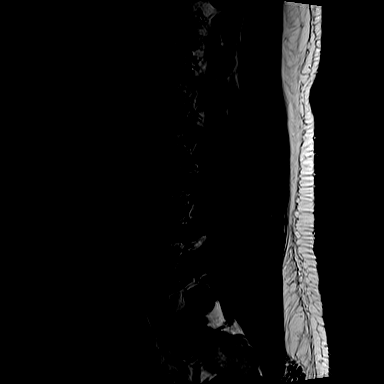
[im 16/16]
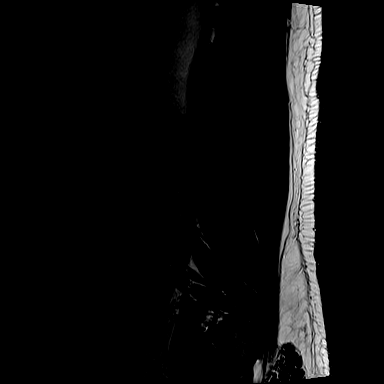

[Series 10: T1 · sagittal · 4.0mm · 0.88mm/px · 7 of 16 slices shown (1 of 2)]
[im 1/16]
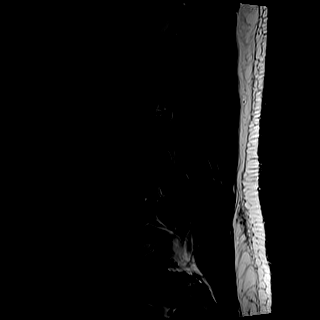
[im 3/16]
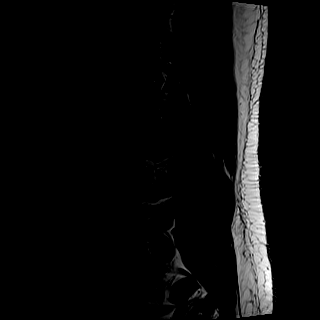
[im 6/16]
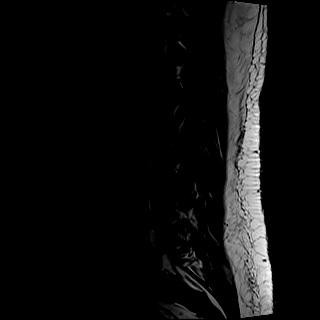
[im 8/16]
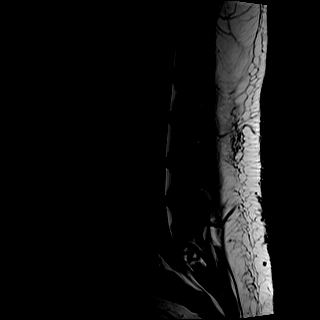
[im 11/16]
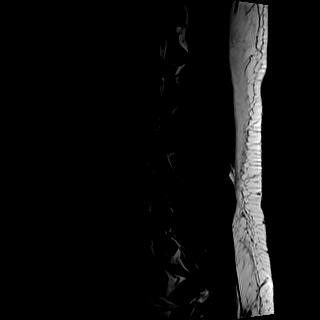
[im 13/16]
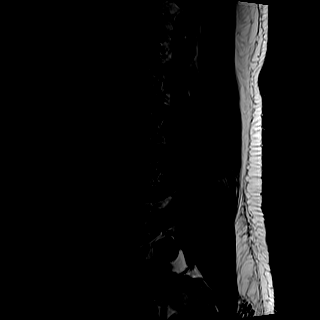
[im 16/16]
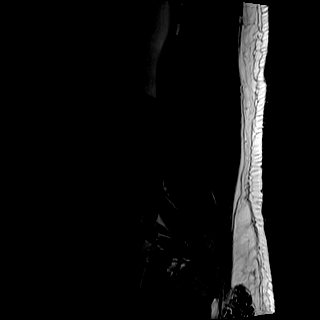

[Series 12: T2 · axial · 4.0mm · 0.57mm/px · z∈[-123,+70]mm · 8 of 34 slices shown (2 of 2)]
[im 1/34]
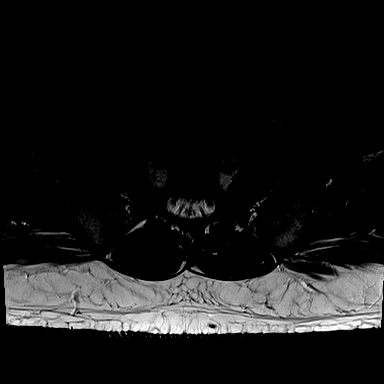
[im 6/34]
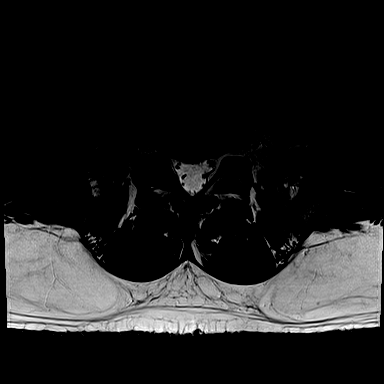
[im 11/34]
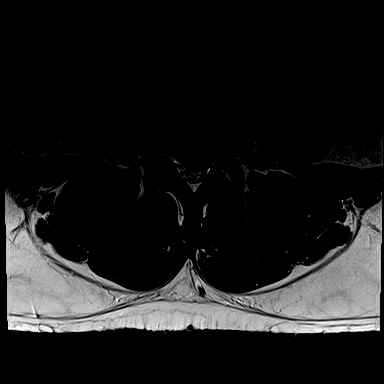
[im 16/34]
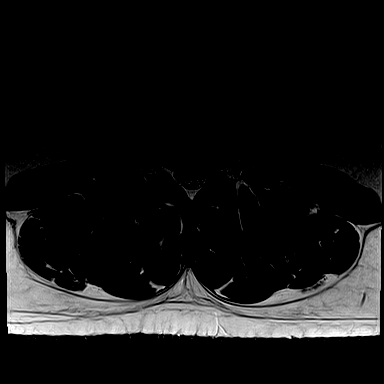
[im 18/34]
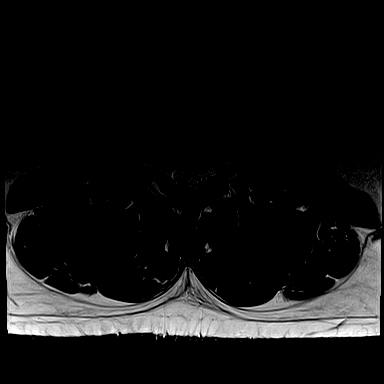
[im 23/34]
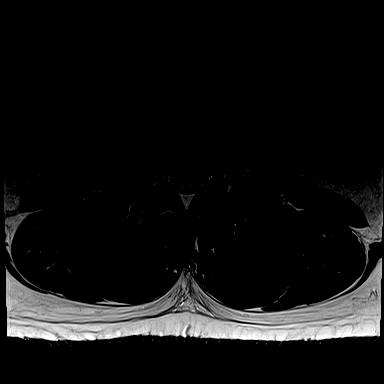
[im 28/34]
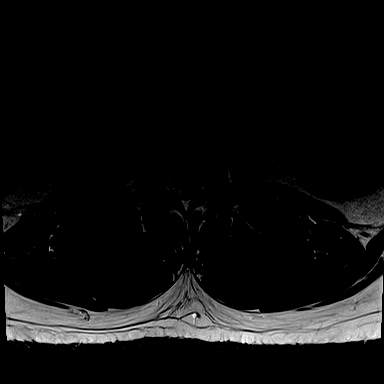
[im 34/34]
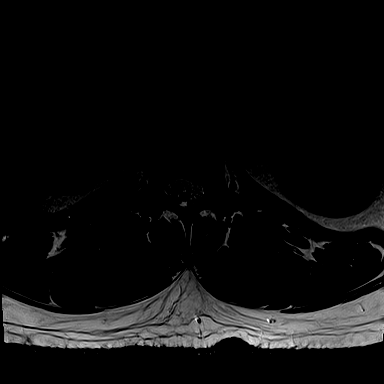

[Series 13: T1 · axial · 4.0mm · 0.34mm/px · z∈[-123,+40]mm · 5 of 34 slices shown (2 of 2)]
[im 1/34]
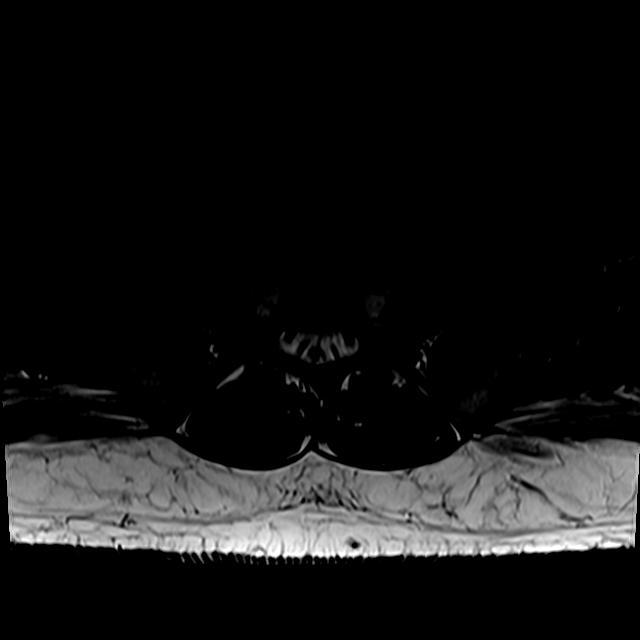
[im 6/34]
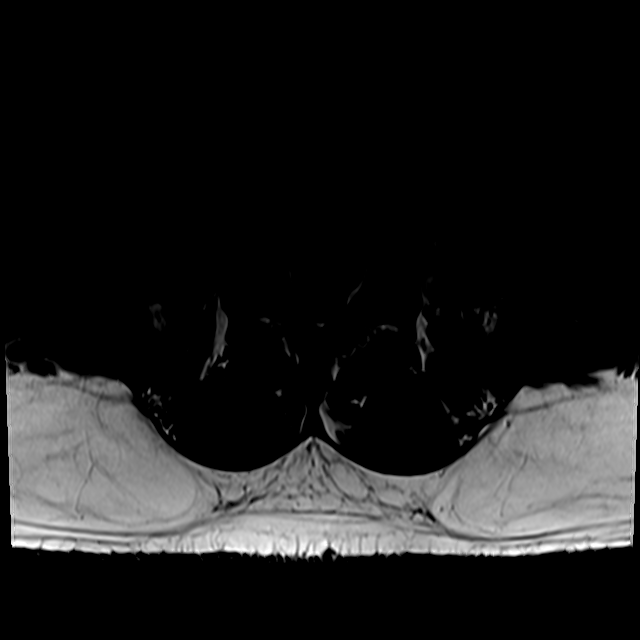
[im 11/34]
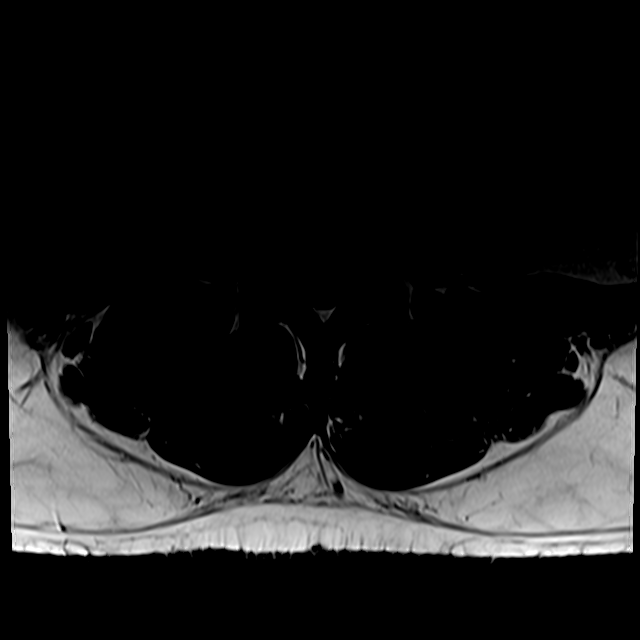
[im 18/34]
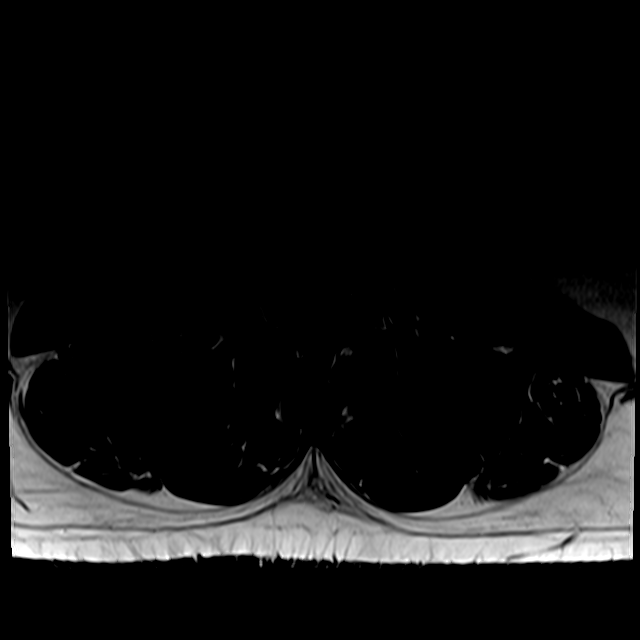
[im 28/34]
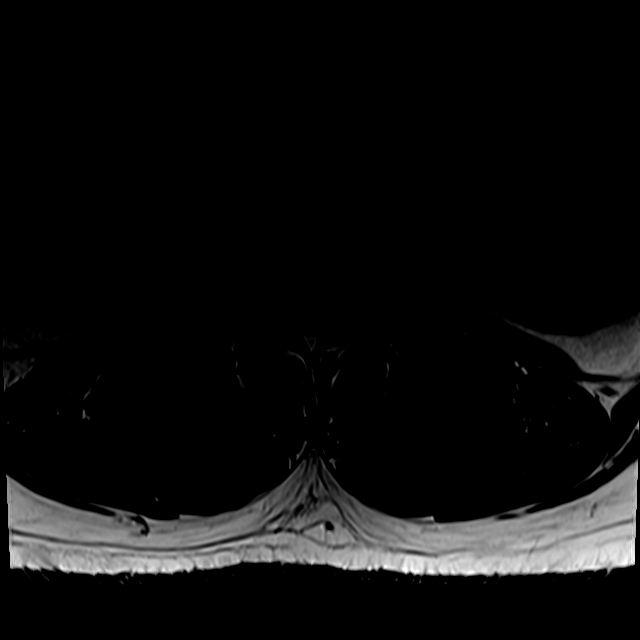

[26 of 48 positions shown; findings below may reference images not displayed]

FINDINGS: Segmentation:  Standard.

Alignment: Slight straightening of the normal lumbar lordosis, but
no subluxation.

Vertebrae: No worrisome osseous lesion. Modic type 2 changes L3-4
related to chronic disc space narrowing. Similar less severe Modic
type 2 changes L4-5. Posterior endplate remodeling at L1-2,
suggesting chronicity.

Conus medullaris and cauda equina: Conus extends to the L1 level.
Abnormal cord signal affecting the distal thoracic cord opposite
T11-12 appears to be due to cord compression by large disc
extrusion. Thoracic spine MRI recommended for further evaluation.

Paraspinal and other soft tissues: No hydronephrosis or renal mass.
Possible LEFT renal calculi. Consider sonography or CT if further
assessment desired.

Disc levels:

T11-T12: Sagittal only. Large disc extrusion. Cord compression,
possible abnormal cord signal.

L1-L2: Central and leftward disc extrusion, caudally migrated
fragment. Mild stenosis. LEFT L2 neural impingement.

L2-L3:  Disc desiccation.  Annular bulge.  No impingement.

L3-L4: Central and rightward extrusion. Disc material extends to
both foramina. Moderate stenosis. Disc space narrowing. Mild
posterior element hypertrophy. BILATERAL L4 neural impingement is
possible. Foraminal narrowing does not clearly affect the L3 nerve
roots.

L4-L5: Disc space narrowing. Central and leftward extrusion.
Moderate to severe stenosis. LEFT greater than RIGHT L5 neural
impingement. Some foraminal narrowing not clearly compressive.

L5-S1: Disc space narrowing. Central and rightward extrusion. RIGHT
S1 neural impingement. Foraminal narrowing not clearly compressive.
Facet arthropathy.
IMPRESSION: Multilevel spondylosis as described. Potentially symptomatic
LEFT-sided neural impingement at L1-2, L3-4, and L4-5.

Leftward disc extrusion at T11-12 is incompletely evaluated. Concern
for significant cord compression at this level. MRI of the thoracic
spine without contrast is recommended for further evaluation.

These results will be called to the ordering clinician or
representative by the Radiologist Assistant, and communication
documented in the PACS or zVision Dashboard.

## 2023-06-06 ENCOUNTER — Ambulatory Visit: Payer: 59 | Admitting: Podiatry

## 2023-06-06 DIAGNOSIS — B351 Tinea unguium: Secondary | ICD-10-CM

## 2023-06-06 DIAGNOSIS — L609 Nail disorder, unspecified: Secondary | ICD-10-CM | POA: Diagnosis not present

## 2023-06-06 NOTE — Progress Notes (Unsigned)
     Chief Complaint  Patient presents with   Nail Problem    Possible nail fungus bilateral great toes, the right is there worst. Nails are black and discolored.  Started out a little black dot, now the entire nail is black. Started 3 months ago, not diabetic, no anticoags. 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   HPI: 33 y.o. male presents today with concern of thickness and discoloration to both great toenails.  He notes he is concerned about the dark/black discoloration to the right great toenail.  Denies any injury.  States he is not on any blood thinners.  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  He also wanted the left great toenail checked.  He is pretty sure it is fungal but feels like it has been improving.  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  The right hallux nail has blackened/purple discoloration underneath the nail indicating subungual hematoma.  This was debrided and some of the discoloration was able to be sanded away revealing healthy appearing nail bed.  Clippings were obtained for the lab.  The left hallux nail shows some yellow discoloration, thickness, subungual debris and distal onycholysis, but clearing of the nail on the proximal 40%.  There is minimal discomfort with compression of the hallux nails.  Epicritic sensation is intact  Assessment/Plan of Care: 1. Nail  abnormality   2. Dermatophytosis of nail    Discussed clinical findings with patient today.  Clippings of the right hallux nail were obtained and sent [facility].  Due to his and his family is concerned that this might be melanoma, we will try to alleviate their concerns.  Informed the patient that this looks like a subungual hematoma that has dried.  Upon clipping and debridement of the nail most of the darkened discoloration was able to be removed and it seemed to alleviate some of his concerns but we will still send off the nail for pathology and possible fungal identification.  The left hallux nail was debrided today.  Since there is clearing along the proximal 40% of the nail as long as this continues to grow out healthy, he may not need to start any treatment to this toenail.   Clerance Lav, DPM, FACFAS [facility]     [address] [place], [place] [address]                Office [phone]  Fax [phone]

## 2023-06-14 ENCOUNTER — Other Ambulatory Visit: Payer: Self-pay | Admitting: Podiatry

## 2023-06-17 ENCOUNTER — Encounter: Payer: Self-pay | Admitting: Podiatry
